# Patient Record
Sex: Male | Born: 1955 | Race: White | Hispanic: No | Marital: Married | Smoking: Former smoker
Health system: Southern US, Community
[De-identification: ages and names within clinical notes are randomized; demographics above are authoritative.]

## PROBLEM LIST (undated history)

## (undated) DIAGNOSIS — N200 Calculus of kidney: Secondary | ICD-10-CM

## (undated) HISTORY — PX: COLONOSCOPY: SHX174

---

## 2008-01-05 ENCOUNTER — Emergency Department: Payer: Self-pay | Admitting: Emergency Medicine

## 2013-01-06 ENCOUNTER — Ambulatory Visit: Payer: Self-pay | Admitting: Medical

## 2013-12-25 ENCOUNTER — Ambulatory Visit: Payer: Self-pay | Admitting: Gastroenterology

## 2015-02-10 ENCOUNTER — Ambulatory Visit: Payer: Self-pay | Admitting: General Practice

## 2015-05-10 ENCOUNTER — Ambulatory Visit: Admit: 2015-05-10 | Payer: Self-pay | Admitting: Specialist

## 2015-05-10 SURGERY — CARPAL TUNNEL RELEASE
Anesthesia: Choice | Laterality: Right

## 2015-06-30 ENCOUNTER — Ambulatory Visit
Admission: RE | Admit: 2015-06-30 | Discharge: 2015-06-30 | Disposition: A | Discharge status: Home / Self Care | Payer: Worker's Compensation | Source: Ambulatory Visit | Attending: Medical | Admitting: Medical

## 2015-06-30 ENCOUNTER — Other Ambulatory Visit: Payer: Self-pay | Admitting: Medical

## 2015-06-30 DIAGNOSIS — M795 Residual foreign body in soft tissue: Secondary | ICD-10-CM

## 2015-08-19 DIAGNOSIS — R683 Clubbing of fingers: Secondary | ICD-10-CM | POA: Insufficient documentation

## 2015-08-19 DIAGNOSIS — D696 Thrombocytopenia, unspecified: Secondary | ICD-10-CM | POA: Insufficient documentation

## 2018-01-07 ENCOUNTER — Ambulatory Visit: Payer: Self-pay | Admitting: Medical

## 2018-01-07 VITALS — BP 140/84 | HR 59 | Temp 97.8°F | Resp 18 | Wt 213.4 lb

## 2018-01-07 DIAGNOSIS — F439 Reaction to severe stress, unspecified: Secondary | ICD-10-CM

## 2018-01-07 NOTE — Progress Notes (Signed)
Subjective:    Patient ID: Tim Hendrix, male    DOB: 01-09-1956, 62 y.o.   MRN: [id number]  HPI  62 yo male in non acute distress.   Patient encouraged by wife to come and get seen for memory. Missed a turn to his cottage and wife felt he should be checked out.   He says  It is stressful to live with his wife. Feels that at home he is walking on egg shells to see if she gets mad. She is constantly telling him what to do and "picking at me".  Wife's mother had Alzheimer's and so she wanted him checked out.  History of  Alzheimer's in paternal grandmother at  61yo  His father has mild Dementia at  61 yo still driving. His mother he says has some memory problems but no offical diagnosis. Still driving.Both live together and , youngest brother lives with them , "he never left home".  Wife told him if he does not see some one she will not let him burn wood in the fireplace at their home." so that is why I am here"   Review of Systems  Constitutional: Negative for chills and fever.  HENT: Positive for congestion (cold  2 weeks ago.) and sore throat (scracthcy at the start of his cold  2 days ago x 2 days, now resolvedd). Negative for ear pain.   Respiratory: Positive for cough (mild, every now and then ). Negative for shortness of breath.   Cardiovascular: Negative for chest pain.  Gastrointestinal: Negative for abdominal pain.  Endocrine: Negative for polydipsia, polyphagia and polyuria.  Musculoskeletal: Negative for myalgias.  Skin: Negative for rash.  Neurological: Negative for dizziness, tremors, seizures, syncope, facial asymmetry, speech difficulty, weakness, light-headedness and headaches.  Psychiatric/Behavioral: Positive for sleep disturbance (sometimes wake up a couple of times / night not every night). Negative for behavioral problems, confusion, decreased concentration, hallucinations, self-injury and suicidal ideas. The patient is not nervous/anxious.    Needs knee  replacement right side  Stress with family.  History of daugher pregnant at  61 yo but she is doing better now.  61 yo granddaughter has 3 children ages  615, 1.5, 3 months ( first child at  61 yo). And "that is stressful". They are on welfare.    Objective:   Physical Exam  Constitutional: He is oriented to person, place, and time. He appears well-developed and well-nourished.  HENT:  Head: Normocephalic and atraumatic.  Eyes: Conjunctivae and EOM are normal. Pupils are equal, round, and reactive to light.  Neck: Normal range of motion.  Cardiovascular: Normal rate, regular rhythm and normal heart sounds.  Pulmonary/Chest: Effort normal and breath sounds normal.  Neurological: He is alert and oriented to person, place, and time. He has normal strength. He displays a negative Romberg sign. GCS eye subscore is 4. GCS verbal subscore is 5. GCS motor subscore is 6.  Reflex Scores:      Patellar reflexes are 2+ on the right side and 2+ on the left side. Skin: Skin is warm and dry.  Psychiatric: He has a normal mood and affect. His speech is normal and behavior is normal. Judgment and thought content normal. Cognition and memory are normal.     Conversation and memory all within normal limitis.  Negative Rhomberg, finger to nose and heel to toe walk within normal limits.     Assessment & Plan:  Well exam  /  Stress MMSE done  30 points scored.Scanned.  Reviewed with patient stress , given [employer] and he was happy to receive that. Paperwork. Offered him to see Neurology but he feels he does not need to see them. He says his wife just "picks" at him and he wants to be able to use the fireplace to save money during the cold weather. Return to the clinic as needed.  Patient verbalizes understanding and has no questions at discharge.

## 2018-01-07 NOTE — Patient Instructions (Signed)
Stress and Stress Management Stress is a normal reaction to life events. It is what you feel when life demands more than you are used to or more than you can handle. Some stress can be useful. For example, the stress reaction can help you catch the last bus of the day, study for a test, or meet a deadline at work. But stress that occurs too often or for too long can cause problems. It can affect your emotional health and interfere with relationships and normal daily activities. Too much stress can weaken your immune system and increase your risk for physical illness. If you already have a medical problem, stress can make it worse. What are the causes? All sorts of life events may cause stress. An event that causes stress for one person may not be stressful for another person. Major life events commonly cause stress. These may be positive or negative. Examples include losing your job, moving into a new home, getting married, having a baby, or losing a loved one. Less obvious life events may also cause stress, especially if they occur day after day or in combination. Examples include working long hours, driving in traffic, caring for children, being in debt, or being in a difficult relationship. What are the signs or symptoms? Stress may cause emotional symptoms including, the following:  Anxiety. This is feeling worried, afraid, on edge, overwhelmed, or out of control.  Anger. This is feeling irritated or impatient.  Depression. This is feeling sad, down, helpless, or guilty.  Difficulty focusing, remembering, or making decisions.  Stress may cause physical symptoms, including the following:  Aches and pains. These may affect your head, neck, back, stomach, or other areas of your body.  Tight muscles or clenched jaw.  Low energy or trouble sleeping.  Stress may cause unhealthy behaviors, including the following:  Eating to feel better (overeating) or skipping meals.  Sleeping too little,  too much, or both.  Working too much or putting off tasks (procrastination).  Smoking, drinking alcohol, or using drugs to feel better.  How is this diagnosed? Stress is diagnosed through an assessment by your health care provider. Your health care provider will ask questions about your symptoms and any stressful life events.Your health care provider will also ask about your medical history and may order blood tests or other tests. Certain medical conditions and medicine can cause physical symptoms similar to stress. Mental illness can cause emotional symptoms and unhealthy behaviors similar to stress. Your health care provider may refer you to a mental health professional for further evaluation. How is this treated? Stress management is the recommended treatment for stress.The goals of stress management are reducing stressful life events and coping with stress in healthy ways. Techniques for reducing stressful life events include the following:  Stress identification. Self-monitor for stress and identify what causes stress for you. These skills may help you to avoid some stressful events.  Time management. Set your priorities, keep a calendar of events, and learn to say "no." These tools can help you avoid making too many commitments.  Techniques for coping with stress include the following:  Rethinking the problem. Try to think realistically about stressful events rather than ignoring them or overreacting. Try to find the positives in a stressful situation rather than focusing on the negatives.  Exercise. Physical exercise can release both physical and emotional tension. The key is to find a form of exercise you enjoy and do it regularly.  Relaxation techniques. These relax the body and  mind. Examples include yoga, meditation, tai chi, biofeedback, deep breathing, progressive muscle relaxation, listening to music, being out in nature, journaling, and other hobbies. Again, the key is to find  one or more that you enjoy and can do regularly.  Healthy lifestyle. Eat a balanced diet, get plenty of sleep, and do not smoke. Avoid using alcohol or drugs to relax.  Strong support network. Spend time with family, friends, or other people you enjoy being around.Express your feelings and talk things over with someone you trust.  Counseling or talktherapy with a mental health professional may be helpful if you are having difficulty managing stress on your own. Medicine is typically not recommended for the treatment of stress.Talk to your health care provider if you think you need medicine for symptoms of stress. Follow these instructions at home:  Keep all follow-up visits as directed by your health care provider.  Take all medicines as directed by your health care provider. Contact a health care provider if:  Your symptoms get worse or you start having new symptoms.  You feel overwhelmed by your problems and can no longer manage them on your own. Get help right away if:  You feel like hurting yourself or someone else. This information is not intended to replace advice given to you by your health care provider. Make sure you discuss any questions you have with your health care provider. Document Released: 05/29/2001 Document Revised: 05/10/2016 Document Reviewed: 07/28/2013 Elsevier Interactive Patient Education  2017 Elsevier Inc.  

## 2018-04-08 ENCOUNTER — Ambulatory Visit: Payer: Self-pay | Admitting: Medical

## 2018-04-08 VITALS — BP 152/84 | HR 61 | Temp 98.1°F | Resp 16 | Ht 69.0 in | Wt 207.6 lb

## 2018-04-08 DIAGNOSIS — K649 Unspecified hemorrhoids: Secondary | ICD-10-CM

## 2018-04-08 MED ORDER — HYDROCORTISONE ACE-PRAMOXINE 1-1 % RE FOAM: 1.0000 | Freq: Two times a day (BID) | RECTAL | 0 refills | Status: DC

## 2018-04-08 NOTE — Progress Notes (Signed)
   Subjective:    Patient ID: Noland Fordyceorman H Lemmerman, male    DOB: [birth year], 62 y.o.   MRN: [id number]  HPI 62 yo male in non acute distress. Comes in today presenting with complaints of Hemorrhoid. Pain on Saturday. Previous history of hemmorrhoids told he had them internal hemorrhoid as well.. Has tried nothing over the counter. This hemorrhoid "isn't bleeding " nor have I had much pain since Saturday"    Review of Systems  Constitutional: Positive for chills. Negative for fever.  HENT: Negative for congestion, ear discharge and sore throat.   Eyes: Positive for pain (had irriation in both eyes with pollen and wearing contacts. no pain today and is wearing his contacts.). Negative for discharge and itching.  Respiratory: Negative for cough and shortness of breath.   Cardiovascular: Negative for chest pain, palpitations and leg swelling.  Gastrointestinal: Positive for anal bleeding and rectal pain (with wiping "a little bit"). Negative for abdominal distention, abdominal pain, blood in stool, constipation, diarrhea, nausea and vomiting.  Endocrine: Negative for cold intolerance and heat intolerance.  Genitourinary: Negative for difficulty urinating and dysuria.  Musculoskeletal: Negative for myalgias.  Skin: Negative for rash.  Allergic/Immunologic: Positive for environmental allergies. Negative for food allergies and immunocompromised state.  Neurological: Negative for dizziness, syncope and light-headedness.  Hematological: Negative for adenopathy.  Psychiatric/Behavioral: Negative for behavioral problems, self-injury and suicidal ideas. The patient is not nervous/anxious.    Colonoscopy at  56 next one at  62 yo.    Objective:   Physical Exam  Constitutional: He is oriented to person, place, and time. He appears well-developed and well-nourished.  HENT:  Head: Normocephalic and atraumatic.  Eyes: Pupils are equal, round, and reactive to light. Conjunctivae and EOM are normal.  Neck:  Normal range of motion. Neck supple.  Genitourinary:     Neurological: He is alert and oriented to person, place, and time.  Skin: Skin is warm and dry.  Psychiatric: He has a normal mood and affect. His behavior is normal. Judgment and thought content normal.  Nursing note and vitals reviewed.    Multimedia programmerBrandy Cozart RN chaperoned  Noted hemorrhoid the size of a small grape, not thrombosed. Not bleeding. Small skin tag noted at  12 O'Clock.    Assessment & Plan:  Hemorrhoids rectum. Warm Sitz baths. Recommended a stool softener like  OTC Senokot.  Meds ordered this encounter  Medications  . hydrocortisone-pramoxine (PROCTOFOAM HC) rectal foam    Sig: Place 1 applicator rectally 2 (two) times daily.    Dispense:  10 g    Refill:  0  Return on May 1st for recheck. Check on Pateint status of Tdap in [facility] currently down. Patient verbalizes understanding and has no questions at discharge.

## 2018-04-08 NOTE — Patient Instructions (Addendum)
How to Take a Sitz Bath A sitz bath is a warm water bath that is taken while you are sitting down. The water should only come up to your hips and should cover your buttocks. Your health care provider may recommend a sitz bath to help you:  Clean the lower part of your body, including your genital area.  With itching.  With pain.  With sore muscles or muscles that tighten or spasm.  How to take a sitz bath Take 3-4 sitz baths per day or as told by your health care provider. 1. Partially fill a bathtub with warm water. You will only need the water to be deep enough to cover your hips and buttocks when you are sitting in it. 2. If your health care provider told you to put medicine in the water, follow the directions exactly. 3. Sit in the water and open the tub drain a little. 4. Turn on the warm water again to keep the tub at the correct level. Keep the water running constantly. 5. Soak in the water for 15-20 minutes or as told by your health care provider. 6. After the sitz bath, pat the affected area dry first. Do not rub it. 7. Be careful when you stand up after the sitz bath because you may feel dizzy.  Contact a health care provider if:  Your symptoms get worse. Do not continue with sitz baths if your symptoms get worse.  You have new symptoms. Do not continue with sitz baths until you talk with your health care provider. This information is not intended to replace advice given to you by your health care provider. Make sure you discuss any questions you have with your health care provider. Document Released: 08/25/2004 Document Revised: 05/02/2016 Document Reviewed: 12/01/2014 Elsevier Interactive Patient Education  2018 [employer]. OTC Senokot take as directed.   Hemorrhoids Hemorrhoids are swollen veins in and around the rectum or anus. Hemorrhoids can cause pain, itching, or bleeding. Most of the time, they do not cause serious problems. They usually get better with diet  changes, lifestyle changes, and other home treatments. Follow these instructions at home: Eating and drinking  Eat foods that have fiber, such as whole grains, beans, nuts, fruits, and vegetables. Ask your doctor about taking products that have added fiber (fibersupplements).  Drink enough fluid to keep your pee (urine) clear or pale yellow. For Pain and Swelling  Take a warm-water bath (sitz bath) for 20 minutes to ease pain. Do this 3-4 times a day.  If directed, put ice on the painful area. It may be helpful to use ice between your warm baths. ? Put ice in a plastic bag. ? Place a towel between your skin and the bag. ? Leave the ice on for 20 minutes, 2-3 times a day. General instructions  Take over-the-counter and prescription medicines only as told by your doctor. ? Medicated creams and medicines that are inserted into the anus (suppositories) may be used or applied as told.  Exercise often.  Go to the bathroom when you have the urge to poop (to have a bowel movement). Do not wait.  Avoid pushing too hard (straining) when you poop.  Keep the butt area dry and clean. Use wet toilet paper or moist paper towels.  Do not sit on the toilet for a long time. Contact a doctor if:  You have any of these: ? Pain and swelling that do not get better with treatment or medicine. ? Bleeding that will  not stop. ? Trouble pooping or you cannot poop. ? Pain or swelling outside the area of the hemorrhoids. This information is not intended to replace advice given to you by your health care provider. Make sure you discuss any questions you have with your health care provider. Document Released: 09/11/2008 Document Revised: 05/10/2016 Document Reviewed: 08/17/2015 Elsevier Interactive Patient Education  2018 [employer]  Constipation, Adult Constipation is when a person:  Poops (has a bowel movement) fewer times in a week than normal.  Has a hard time pooping.  Has poop that is dry,  hard, or bigger than normal.  Follow these instructions at home: Eating and drinking   Eat foods that have a lot of fiber, such as: ? Fresh fruits and vegetables. ? Whole grains. ? Beans.  Eat less of foods that are high in fat, low in fiber, or overly processed, such as: ? [place] fries. ? Hamburgers. ? Cookies. ? Candy. ? Soda.  Drink enough fluid to keep your pee (urine) clear or pale yellow. General instructions  Exercise regularly or as told by your doctor.  Go to the restroom when you feel like you need to poop. Do not hold it in.  Take over-the-counter and prescription medicines only as told by your doctor. These include any fiber supplements.  Do pelvic floor retraining exercises, such as: ? Doing deep breathing while relaxing your lower belly (abdomen). ? Relaxing your pelvic floor while pooping.  Watch your condition for any changes.  Keep all follow-up visits as told by your doctor. This is important. Contact a doctor if:  You have pain that gets worse.  You have a fever.  You have not pooped for 4 days.  You throw up (vomit).  You are not hungry.  You lose weight.  You are bleeding from the anus.  You have thin, pencil-like poop (stool). Get help right away if:  You have a fever, and your symptoms suddenly get worse.  You leak poop or have blood in your poop.  Your belly feels hard or bigger than normal (is bloated).  You have very bad belly pain.  You feel dizzy or you faint. This information is not intended to replace advice given to you by your health care provider. Make sure you discuss any questions you have with your health care provider. Document Released: 05/21/2008 Document Revised: 06/22/2016 Document Reviewed: 05/23/2016 Elsevier Interactive Patient Education  2018 [employer]

## 2018-04-16 ENCOUNTER — Ambulatory Visit: Payer: Self-pay | Admitting: Medical

## 2018-04-16 VITALS — BP 143/83 | HR 97 | Temp 98.0°F | Resp 16 | Ht 69.0 in | Wt 207.0 lb

## 2018-04-16 DIAGNOSIS — K644 Residual hemorrhoidal skin tags: Secondary | ICD-10-CM

## 2018-04-16 NOTE — Patient Instructions (Signed)
Hemorrhoids    Hemorrhoids are swollen veins in and around the rectum or anus. Hemorrhoids can cause pain, itching, or bleeding. Most of the time, they do not cause serious problems. They usually get better with diet changes, lifestyle changes, and other home treatments.  Follow these instructions at home:  Eating and drinking  · Eat foods that have fiber, such as whole grains, beans, nuts, fruits, and vegetables. Ask your doctor about taking products that have added fiber (fiber supplements).  · Drink enough fluid to keep your pee (urine) clear or pale yellow.  For Pain and Swelling  · Take a warm-water bath (sitz bath) for 20 minutes to ease pain. Do this 3-4 times a day.  · If directed, put ice on the painful area. It may be helpful to use ice between your warm baths.  ¨ Put ice in a plastic bag.  ¨ Place a towel between your skin and the bag.  ¨ Leave the ice on for 20 minutes, 2-3 times a day.  General instructions  · Take over-the-counter and prescription medicines only as told by your doctor.  ¨ Medicated creams and medicines that are inserted into the anus (suppositories) may be used or applied as told.  · Exercise often.  · Go to the bathroom when you have the urge to poop (to have a bowel movement). Do not wait.  · Avoid pushing too hard (straining) when you poop.  · Keep the butt area dry and clean. Use wet toilet paper or moist paper towels.  · Do not sit on the toilet for a long time.  Contact a doctor if:  · You have any of these:  ¨ Pain and swelling that do not get better with treatment or medicine.  ¨ Bleeding that will not stop.  ¨ Trouble pooping or you cannot poop.  ¨ Pain or swelling outside the area of the hemorrhoids.  This information is not intended to replace advice given to you by your health care provider. Make sure you discuss any questions you have with your health care provider.  Document Released: 09/11/2008 Document Revised: 05/10/2016 Document Reviewed: 08/17/2015  Elsevier  Interactive Patient Education © 2018 Elsevier Inc.   

## 2018-04-16 NOTE — Progress Notes (Signed)
   Subjective:    Patient ID: KAIVEN VESTER, male    DOB: 06-04-1956, 62 y.o.   MRN: [id number]  HPI 62 yo male in non acute distress. Returns for hemorrhoid recheck , says he is doing much better.Stool softener helped.  Started Methylprednisone on Monday for swelling in right knee after injection.  Sees [place] Neighbor PA-C. Doing better now.  Has been doing Sitz baths.  Last BM this morning at  7:00am , seemed normal per patient , no pain with bowel movement.  Review of Systems  Constitutional: Negative for chills and fever.  Respiratory: Negative for cough and shortness of breath.   Cardiovascular: Negative for chest pain.  Gastrointestinal: Negative for abdominal distention, abdominal pain, anal bleeding, blood in stool, constipation, diarrhea, nausea, rectal pain and vomiting.  Neurological: Negative for dizziness, syncope and light-headedness.  Psychiatric/Behavioral: Negative for self-injury and suicidal ideas. The patient is not nervous/anxious.        Objective:   Physical Exam  Constitutional: He is oriented to person, place, and time. He appears well-developed and well-nourished.  Neurological: He is alert and oriented to person, place, and time.  Skin: Skin is warm and dry.  Psychiatric: He has a normal mood and affect. His behavior is normal. Judgment and thought content normal.  Nursing note and vitals reviewed.     Slightly smaller , not thrombosed.pink and fleshy    Assessment & Plan:  Hemorrhoid improved On Methylprednisone for knee x 6 days. To try preparation H, since he is on oral steroids.  Return as needed if it continues to bother patient will refer to Surgery. Patient verbalizes understanding and has no questions at discharge.

## 2018-04-30 ENCOUNTER — Ambulatory Visit: Payer: Self-pay | Admitting: Medical

## 2018-04-30 VITALS — BP 135/88 | HR 72 | Temp 98.0°F | Resp 16 | Wt 203.2 lb

## 2018-04-30 DIAGNOSIS — K649 Unspecified hemorrhoids: Secondary | ICD-10-CM

## 2018-04-30 NOTE — Progress Notes (Signed)
   Subjective:    Patient ID: Tim Hendrix, male    DOB: March 08, 1956, 62 y.o.   MRN: [id number]  HPI 62 yo  Male in non acute distress. Using Preparation H on Hemorrhoid,  Shrinking still, BM  Twice a day , no pain. History of  Colonoscopy  At 56  All was fine per patient.  Also takes chondrotin and glucosamine.  Was taking Claritin and then stopped be cause he felt better, Review of Systems  Constitutional: Negative for chills, fatigue and fever.  HENT: Positive for congestion. Negative for ear pain and sore throat.   Eyes: Negative for discharge, itching and visual disturbance.  Respiratory: Positive for cough and wheezing (better now , occasionally with mowing the grass). Negative for chest tightness.   Cardiovascular: Negative for chest pain, palpitations and leg swelling.  Gastrointestinal: Negative for abdominal pain, anal bleeding, blood in stool, constipation, diarrhea, nausea and vomiting.  Endocrine: Negative for polydipsia, polyphagia and polyuria.  Genitourinary: Negative for dysuria and hematuria.  Musculoskeletal: Positive for arthralgias (" I have joint pain , needs knee replacement on the right  sees  Cala Bradford PA-C" sometimes shoulder pain right side. Marland Kitchen). Negative for myalgias.  Skin: Negative for rash.  Allergic/Immunologic: Positive for environmental allergies. Negative for food allergies.  Neurological: Negative for dizziness, syncope and light-headedness.  Hematological: Negative for adenopathy.  Psychiatric/Behavioral: Negative for behavioral problems, confusion, self-injury and suicidal ideas. The patient is not nervous/anxious.        Objective:   Physical Exam  Constitutional: He appears well-developed and well-nourished.  HENT:  Head: Normocephalic and atraumatic.  Eyes: Pupils are equal, round, and reactive to light. Conjunctivae and EOM are normal.  Genitourinary:  Genitourinary Comments: Hemorrhoid on the 12 O'clock side of rectum.  Skin:  Skin is warm and dry.  Psychiatric: He has a normal mood and affect. His behavior is normal. Judgment and thought content normal.  Nursing note and vitals reviewed.  Non thrombosed, shrinking some from initial visit.        Assessment & Plan:  Hemorrhoid Rawness of the buttock area is now resolved.  Hemorrhoid at  12O'Clock shrinking , patient to continue Preparation H as directed. Return to the clinic if any concerns. Explained that diarrhea or constipation can cause further inflammation. He verbalizes understanding and has no questions at this time.

## 2018-04-30 NOTE — Patient Instructions (Signed)
How to Take a Sitz Bath A sitz bath is a warm water bath that is taken while you are sitting down. The water should only come up to your hips and should cover your buttocks. Your health care provider may recommend a sitz bath to help you:  Clean the lower part of your body, including your genital area.  With itching.  With pain.  With sore muscles or muscles that tighten or spasm.  How to take a sitz bath Take 3-4 sitz baths per day or as told by your health care provider. 1. Partially fill a bathtub with warm water. You will only need the water to be deep enough to cover your hips and buttocks when you are sitting in it. 2. If your health care provider told you to put medicine in the water, follow the directions exactly. 3. Sit in the water and open the tub drain a little. 4. Turn on the warm water again to keep the tub at the correct level. Keep the water running constantly. 5. Soak in the water for 15-20 minutes or as told by your health care provider. 6. After the sitz bath, pat the affected area dry first. Do not rub it. 7. Be careful when you stand up after the sitz bath because you may feel dizzy.  Contact a health care provider if:  Your symptoms get worse. Do not continue with sitz baths if your symptoms get worse.  You have new symptoms. Do not continue with sitz baths until you talk with your health care provider. This information is not intended to replace advice given to you by your health care provider. Make sure you discuss any questions you have with your health care provider. Document Released: 08/25/2004 Document Revised: 05/02/2016 Document Reviewed: 12/01/2014 Elsevier Interactive Patient Education  2018 Elsevier Inc. Hemorrhoids Hemorrhoids are swollen veins in and around the rectum or anus. Hemorrhoids can cause pain, itching, or bleeding. Most of the time, they do not cause serious problems. They usually get better with diet changes, lifestyle changes, and other  home treatments. Follow these instructions at home: Eating and drinking  Eat foods that have fiber, such as whole grains, beans, nuts, fruits, and vegetables. Ask your doctor about taking products that have added fiber (fibersupplements).  Drink enough fluid to keep your pee (urine) clear or pale yellow. For Pain and Swelling  Take a warm-water bath (sitz bath) for 20 minutes to ease pain. Do this 3-4 times a day.  If directed, put ice on the painful area. It may be helpful to use ice between your warm baths. ? Put ice in a plastic bag. ? Place a towel between your skin and the bag. ? Leave the ice on for 20 minutes, 2-3 times a day. General instructions  Take over-the-counter and prescription medicines only as told by your doctor. ? Medicated creams and medicines that are inserted into the anus (suppositories) may be used or applied as told.  Exercise often.  Go to the bathroom when you have the urge to poop (to have a bowel movement). Do not wait.  Avoid pushing too hard (straining) when you poop.  Keep the butt area dry and clean. Use wet toilet paper or moist paper towels.  Do not sit on the toilet for a long time. Contact a doctor if:  You have any of these: ? Pain and swelling that do not get better with treatment or medicine. ? Bleeding that will not stop. ? Trouble pooping or you   cannot poop. ? Pain or swelling outside the area of the hemorrhoids. This information is not intended to replace advice given to you by your health care provider. Make sure you discuss any questions you have with your health care provider. Document Released: 09/11/2008 Document Revised: 05/10/2016 Document Reviewed: 08/17/2015 Elsevier Interactive Patient Education  2018 Elsevier Inc.  

## 2019-01-28 ENCOUNTER — Emergency Department
Admission: EM | Admit: 2019-01-28 | Discharge: 2019-01-28 | Disposition: A | Discharge status: Home / Self Care | Payer: BLUE CROSS/BLUE SHIELD | Attending: Student in an Organized Health Care Education/Training Program | Admitting: Student in an Organized Health Care Education/Training Program

## 2019-01-28 ENCOUNTER — Other Ambulatory Visit: Payer: Self-pay

## 2019-01-28 ENCOUNTER — Encounter: Payer: Self-pay | Admitting: Emergency Medicine

## 2019-01-28 ENCOUNTER — Emergency Department: Payer: BLUE CROSS/BLUE SHIELD

## 2019-01-28 DIAGNOSIS — N1330 Unspecified hydronephrosis: Secondary | ICD-10-CM | POA: Diagnosis not present

## 2019-01-28 DIAGNOSIS — R109 Unspecified abdominal pain: Secondary | ICD-10-CM

## 2019-01-28 DIAGNOSIS — Z87891 Personal history of nicotine dependence: Secondary | ICD-10-CM | POA: Diagnosis not present

## 2019-01-28 HISTORY — DX: Calculus of kidney: N20.0

## 2019-01-28 LAB — CBC WITH DIFFERENTIAL/PLATELET
ABS IMMATURE GRANULOCYTES: 0.01 10*3/uL (ref 0.00–0.07)
BASOS ABS: 0 10*3/uL (ref 0.0–0.1)
BASOS PCT: 0 %
EOS PCT: 0 %
Eosinophils Absolute: 0 10*3/uL (ref 0.0–0.5)
HCT: 45.8 % (ref 39.0–52.0)
Hemoglobin: 15.9 g/dL (ref 13.0–17.0)
Immature Granulocytes: 0 %
LYMPHS PCT: 12 %
Lymphs Abs: 1.2 10*3/uL (ref 0.7–4.0)
MCH: 31.7 pg (ref 26.0–34.0)
MCHC: 34.7 g/dL (ref 30.0–36.0)
MCV: 91.4 fL (ref 80.0–100.0)
MONO ABS: 0.4 10*3/uL (ref 0.1–1.0)
Monocytes Relative: 5 %
NEUTROS ABS: 7.8 10*3/uL — AB (ref 1.7–7.7)
Neutrophils Relative %: 83 %
PLATELETS: 165 10*3/uL (ref 150–400)
RBC: 5.01 MIL/uL (ref 4.22–5.81)
RDW: 11.9 % (ref 11.5–15.5)
WBC: 9.4 10*3/uL (ref 4.0–10.5)
nRBC: 0 % (ref 0.0–0.2)

## 2019-01-28 LAB — COMPREHENSIVE METABOLIC PANEL
ALBUMIN: 4.4 g/dL (ref 3.5–5.0)
ALT: 22 U/L (ref 0–44)
ANION GAP: 7 (ref 5–15)
AST: 23 U/L (ref 15–41)
Alkaline Phosphatase: 45 U/L (ref 38–126)
BUN: 17 mg/dL (ref 8–23)
CO2: 27 mmol/L (ref 22–32)
Calcium: 9 mg/dL (ref 8.9–10.3)
Chloride: 101 mmol/L (ref 98–111)
Creatinine, Ser: 0.79 mg/dL (ref 0.61–1.24)
GFR calc non Af Amer: >60 mL/min (ref >60)
GLUCOSE: 121 mg/dL — AB (ref 70–99)
Potassium: 4 mmol/L (ref 3.5–5.1)
SODIUM: 135 mmol/L (ref 135–145)
Total Bilirubin: 0.9 mg/dL (ref 0.3–1.2)
Total Protein: 7.2 g/dL (ref 6.5–8.1)

## 2019-01-28 LAB — URINALYSIS, COMPLETE (UACMP) WITH MICROSCOPIC
Bacteria, UA: NONE SEEN
Bilirubin Urine: NEGATIVE
GLUCOSE, UA: NEGATIVE mg/dL
Ketones, ur: NEGATIVE mg/dL
LEUKOCYTE UA: NEGATIVE
NITRITE: NEGATIVE
Protein, ur: NEGATIVE mg/dL
Specific Gravity, Urine: 1.027 (ref 1.005–1.030)
Squamous Epithelial / LPF: NONE SEEN (ref 0–5)
pH: 5 (ref 5.0–8.0)

## 2019-01-28 MED ORDER — KETOROLAC TROMETHAMINE 30 MG/ML IJ SOLN
15.0000 mg | Freq: Once | INTRAMUSCULAR | Status: AC
  Administered 2019-01-28: 15 mg via INTRAVENOUS
  Filled 2019-01-28: qty 1

## 2019-01-28 MED ORDER — TAMSULOSIN HCL 0.4 MG PO CAPS: 0.4000 mg | ORAL_CAPSULE | Freq: Every day | ORAL | 0 refills | Status: DC

## 2019-01-28 MED ORDER — PROMETHAZINE HCL 25 MG/ML IJ SOLN
12.5000 mg | Freq: Four times a day (QID) | INTRAMUSCULAR | Status: DC | PRN
  Administered 2019-01-28: 10:00:00 via INTRAVENOUS
  Filled 2019-01-28: qty 1

## 2019-01-28 MED ORDER — MORPHINE SULFATE (PF) 4 MG/ML IV SOLN
4.0000 mg | INTRAVENOUS | Status: DC | PRN
  Administered 2019-01-28: 4 mg via INTRAVENOUS
  Filled 2019-01-28: qty 1

## 2019-01-28 MED ORDER — ONDANSETRON HCL 4 MG PO TABS: 4.0000 mg | ORAL_TABLET | Freq: Every day | ORAL | 0 refills | Status: DC | PRN

## 2019-01-28 NOTE — ED Notes (Signed)
Patient ambulatory to room. Patient here for left sided flank pain that began this morning. History of kidney stones. Patient reports this feels similar. Even and non labored respirations noted. A&O x4.

## 2019-01-28 NOTE — ED Triage Notes (Signed)
Pt presents to ED via POV with c/o L sided flank pain. Pt states hx of kidney stones, is usually able to pass them on his own but today he feels like he cannot. Pt states L sided flank pain since 0230 this morning.

## 2019-01-28 NOTE — ED Provider Notes (Signed)
[facility] Emergency Department Provider Note    First MD Initiated Contact with Patient 01/28/19 [phone]     (approximate)  I have reviewed the triage vital signs and the nursing notes.   HISTORY  Chief Complaint Flank Pain    HPI Tim Hendrix is a 63 y.o. male presents the ER for evaluation of severe acute left flank and left groin pain that woke the patient up from sleep.  States he is having associated nausea.  Having trouble finding a comfortable position.  States it feels similar to previous kidney stones but has never had this much difficulty passing a stone or had this long duration of pain.  Denies any fevers but did have cold chills.  No recent antibiotics.    Past Medical History:  Diagnosis Date  . Kidney stones    History reviewed. No pertinent family history. Past Surgical History:  Procedure Laterality Date  . COLONOSCOPY     Patient Active Problem List   Diagnosis Date Noted  . 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 Thrombocytopenia (HCC) 08/19/2015      Prior to Admission medications   Medication Sig Start Date End Date Taking? Authorizing Provider  Cholecalciferol (VITAMIN D) 2000 units tablet Take 2,000 Units by mouth daily.    [provider]  Lidocaine-Glycerin (PREPARATION H RE) Place rectally.    [provider]  Omega-3 Fatty Acids (FISH OIL PO) Take 1 capsule by mouth daily.    [provider]  ondansetron (ZOFRAN) 4 MG tablet Take 1 tablet (4 mg total) by mouth daily as needed for nausea or vomiting. 01/28/19 01/28/20  Willy Eddy, MD  tamsulosin (FLOMAX) 0.4 MG CAPS capsule Take 1 capsule (0.4 mg total) by mouth daily after supper. 01/28/19   Willy Eddy, MD  Turmeric 500 MG CAPS Take 2 capsules by mouth daily.    [provider]  UNABLE TO FIND Joint Juice    [provider]    Allergies Penicillin g    Social History Social History   Tobacco Use  .  Smoking status: Former Games developer  . Smokeless tobacco: Never Used  Substance Use Topics  . Alcohol use: Yes    Comment: Seldom  . Drug use: Not Currently    Review of Systems Patient denies headaches, rhinorrhea, blurry vision, numbness, shortness of breath, chest pain, edema, cough, abdominal pain, nausea, vomiting, diarrhea, dysuria, fevers, rashes or hallucinations unless otherwise stated above in HPI. ____________________________________________   PHYSICAL EXAM:  VITAL SIGNS: Vitals:   01/28/19 1030 01/28/19 1130  BP: (!) 174/90 139/66  Pulse:  (!) 46  Resp:  16  Temp:    SpO2:  99%    Constitutional: Alert and oriented.  Eyes: Conjunctivae are normal.  Head: Atraumatic. Nose: No congestion/rhinnorhea. Mouth/Throat: Mucous membranes are moist.   Neck: No stridor. Painless ROM.  Cardiovascular: Normal rate, regular rhythm. Grossly normal heart sounds.  Good peripheral circulation. Respiratory: Normal respiratory effort.  No retractions. Lungs CTAB. Gastrointestinal: Soft and nontender. No distention. No abdominal bruits. + left CVA tenderness. Genitourinary:  Musculoskeletal: No lower extremity tenderness nor edema.  No joint effusions. Neurologic:  Normal speech and language. No gross focal neurologic deficits are appreciated. No facial droop Skin:  Skin is warm, dry and intact. No rash noted. Psychiatric: Mood and affect are normal. Speech and behavior are normal.  ____________________________________________   LABS (all labs ordered are listed, but only abnormal results are displayed)  Results for orders placed or  performed during the hospital encounter of 01/28/19 (from the past 24 hour(s))  CBC with Differential/Platelet     Status: Abnormal   Collection Time: 01/28/19  9:09 AM  Result Value Ref Range   WBC 9.4 4.0 - 10.5 K/uL   RBC 5.01 4.22 - 5.81 MIL/uL   Hemoglobin 15.9 13.0 - 17.0 g/dL   HCT 41.3 24.4 - 01.0 %   MCV 91.4 80.0 - 100.0 fL   MCH 31.7 26.0  - 34.0 pg   MCHC 34.7 30.0 - 36.0 g/dL   RDW 27.2 53.6 - 64.4 %   Platelets 165 150 - 400 K/uL   nRBC 0.0 0.0 - 0.2 %   Neutrophils Relative % 83 %   Neutro Abs 7.8 (H) 1.7 - 7.7 K/uL   Lymphocytes Relative 12 %   Lymphs Abs 1.2 0.7 - 4.0 K/uL   Monocytes Relative 5 %   Monocytes Absolute 0.4 0.1 - 1.0 K/uL   Eosinophils Relative 0 %   Eosinophils Absolute 0.0 0.0 - 0.5 K/uL   Basophils Relative 0 %   Basophils Absolute 0.0 0.0 - 0.1 K/uL   Immature Granulocytes 0 %   Abs Immature Granulocytes 0.01 0.00 - 0.07 K/uL  Comprehensive metabolic panel     Status: Abnormal   Collection Time: 01/28/19  9:09 AM  Result Value Ref Range   Sodium 135 135 - 145 mmol/L   Potassium 4.0 3.5 - 5.1 mmol/L   Chloride 101 98 - 111 mmol/L   CO2 27 22 - 32 mmol/L   Glucose, Bld 121 (H) 70 - 99 mg/dL   BUN 17 8 - 23 mg/dL   Creatinine, Ser 0.34 0.61 - 1.24 mg/dL   Calcium 9.0 8.9 - 74.2 mg/dL   Total Protein 7.2 6.5 - 8.1 g/dL   Albumin 4.4 3.5 - 5.0 g/dL   AST 23 15 - 41 U/L   ALT 22 0 - 44 U/L   Alkaline Phosphatase 45 38 - 126 U/L   Total Bilirubin 0.9 0.3 - 1.2 mg/dL   GFR calc non Af Amer >60 >60 mL/min   GFR calc Af Amer >60 >60 mL/min   Anion gap 7 5 - 15  Urinalysis, Complete w Microscopic     Status: Abnormal   Collection Time: 01/28/19 10:03 AM  Result Value Ref Range   Color, Urine YELLOW (A) YELLOW   APPearance CLOUDY (A) CLEAR   Specific Gravity, Urine 1.027 1.005 - 1.030   pH 5.0 5.0 - 8.0   Glucose, UA NEGATIVE NEGATIVE mg/dL   Hgb urine dipstick SMALL (A) NEGATIVE   Bilirubin Urine NEGATIVE NEGATIVE   Ketones, ur NEGATIVE NEGATIVE mg/dL   Protein, ur NEGATIVE NEGATIVE mg/dL   Nitrite NEGATIVE NEGATIVE   Leukocytes,Ua NEGATIVE NEGATIVE   RBC / HPF 0-5 0 - 5 RBC/hpf   WBC, UA 0-5 0 - 5 WBC/hpf   Bacteria, UA NONE SEEN NONE SEEN   Squamous Epithelial / LPF NONE SEEN 0 - 5   Mucus PRESENT     ____________________________________________ ____________________________________________  RADIOLOGY  I personally reviewed all radiographic images ordered to evaluate for the above acute complaints and reviewed radiology reports and findings.  These findings were personally discussed with the patient.  Please see medical record for radiology report.  ____________________________________________   PROCEDURES  Procedure(s) performed:  Procedures    Critical Care performed: no ____________________________________________   INITIAL IMPRESSION / ASSESSMENT AND PLAN / ED COURSE  Pertinent labs & imaging results that were available  during my care of the patient were reviewed by me and considered in my medical decision making (see chart for details).   DDX: stone, pyelo, AAA, dissection, msk strain, lumbago  Tim Hendrix is a 63 y.o. who presents to the ED with flank pain as described above.  Certainly concerning for kidney stone however is the worst pain that he has had therefore will order CT imaging to evaluate for recurrent stone versus AAA or other acute intra-abdominal process.  Will give IV pain medication IV fluids and check urine.  Clinical Course as of Jan 28 1554  Wed Jan 28, 2019  1110 Patient with improvement in symptoms.  Urinalysis shows no evidence of infection.  Renal function stable.  Does have mild right-sided hydro-likely secondary to recently passed stone.  As he is currently pain-free with no evidence of other insidious acute pathology do feel patient stable and appropriate for outpatient referral.   [PR]    Clinical Course User Index [PR] Willy Eddyobinson, Berton Butrick, MD     As part of my medical decision making, I reviewed the following data within the electronic MEDICAL RECORD NUMBER Nursing notes reviewed and incorporated, Labs reviewed, notes from prior ED visits and [place] Controlled Substance Database   ____________________________________________   FINAL  CLINICAL IMPRESSION(S) / ED DIAGNOSES  Final diagnoses:  Flank pain  Hydronephrosis, unspecified hydronephrosis type      NEW MEDICATIONS STARTED DURING THIS VISIT:  Discharge Medication List as of 01/28/2019 11:16 AM    START taking these medications   Details  ondansetron (ZOFRAN) 4 MG tablet Take 1 tablet (4 mg total) by mouth daily as needed for nausea or vomiting., Starting Wed 01/28/2019, Until Thu 01/28/2020, Normal    tamsulosin (FLOMAX) 0.4 MG CAPS capsule Take 1 capsule (0.4 mg total) by mouth daily after supper., Starting Wed 01/28/2019, Normal         Note:  This document was prepared using Dragon voice recognition software and may include unintentional dictation errors.    Willy Eddyobinson, Brieonna Crutcher, MD 01/28/19 1555

## 2019-02-06 ENCOUNTER — Ambulatory Visit (INDEPENDENT_AMBULATORY_CARE_PROVIDER_SITE_OTHER): Payer: BLUE CROSS/BLUE SHIELD | Admitting: Urology

## 2019-02-06 ENCOUNTER — Encounter: Payer: Self-pay | Admitting: Urology

## 2019-02-06 VITALS — BP 146/89 | HR 84 | Ht 70.0 in | Wt 210.0 lb

## 2019-02-06 DIAGNOSIS — N200 Calculus of kidney: Secondary | ICD-10-CM

## 2019-02-06 DIAGNOSIS — N1330 Unspecified hydronephrosis: Secondary | ICD-10-CM

## 2019-02-06 NOTE — Progress Notes (Signed)
02/06/2019 3:23 PM   Tim Hendrix 1956/08/07 [id number]  Referring provider: No referring provider defined for this encounter.  Chief Complaint  Patient presents with   Nephrolithiasis    New Patient    HPI: 63 year old male who presents today for further evaluation of left flank pain.  He was initially seen and evaluated in the emergency room on 01/28/2019 with severe acute onset of left flank pain which woke him up from sleep.  He underwent a noncontrast stone CT scan which showed left hydroureteronephrosis with surrounding stranding but no obvious calculus appreciated.  UA was unremarkable.  All other labs including WBC and creatinine were within normal limits.  He had a second episode of pain earlier this week in the same location.  He reports it was over his left lower quadrant radiated down to his testicle.  It was less severe than the first episode.  Lasted several hours and then resolved.  He has had no pain since.  He denies any associated urinary symptoms including no dysuria or gross hematuria.  No weight loss.  He does have a personal history of nephrolithiasis.  He is able to pass the stones spontaneously.  His last stone episode was greater than a year.    He drinks diet Coke, 2x daily.  He does eat a lot of meat.  No extra salt.    He does mention today that he has the stone that he passed several years ago and would like to bring it in for stone analysis.  PMH: Past Medical History:  Diagnosis Date   Kidney stones     Surgical History: Past Surgical History:  Procedure Laterality Date   COLONOSCOPY      Home Medications:  Allergies as of 02/06/2019      Reactions   Penicillin G Hives, Rash      Medication List       Accurate as of February 06, 2019  3:23 PM. Always use your most recent med list.        FISH OIL PO Take 1 capsule by mouth daily.   PREPARATION H RE Place rectally.   tamsulosin 0.4 MG Caps capsule Commonly known as:   FLOMAX Take 1 capsule (0.4 mg total) by mouth daily after supper.   Turmeric 500 MG Caps Take 2 capsules by mouth daily.   UNABLE TO FIND Joint Juice   Vitamin D 50 MCG (2000 UT) tablet Take 2,000 Units by mouth daily.       Allergies:  Allergies  Allergen Reactions   Penicillin G Hives and Rash    Family History: No family history on file.  Social History:  reports that he has quit smoking. He has never used smokeless tobacco. He reports current alcohol use. He reports previous drug use.  ROS: UROLOGY Frequent Urination?: No Hard to postpone urination?: No Burning/pain with urination?: No Get up at night to urinate?: No Leakage of urine?: No Urine stream starts and stops?: No Trouble starting stream?: No Do you have to strain to urinate?: No Blood in urine?: No Urinary tract infection?: No Sexually transmitted disease?: No Injury to kidneys or bladder?: No Painful intercourse?: No Weak stream?: No Erection problems?: No Penile pain?: No  Gastrointestinal Nausea?: No Vomiting?: No Indigestion/heartburn?: No Diarrhea?: No Constipation?: No  Constitutional Fever: No Night sweats?: No Weight loss?: No Fatigue?: No  Skin Skin rash/lesions?: No Itching?: No  Eyes Blurred vision?: No Double vision?: No  Ears/Nose/Throat Sore throat?: No Sinus problems?: No  Hematologic/Lymphatic Swollen glands?: No Easy bruising?: No  Cardiovascular Leg swelling?: No Chest pain?: No  Respiratory Cough?: No Shortness of breath?: No  Endocrine Excessive thirst?: No  Musculoskeletal Back pain?: No Joint pain?: No  Neurological Headaches?: No Dizziness?: No  Psychologic Depression?: No Anxiety?: No  Physical Exam: BP (!) 146/89    Pulse 84    Ht 5\' 10"  (1.778 m)    Wt 210 lb (95.3 kg)    BMI 30.13 kg/m   Constitutional:  Alert and oriented, No acute distress. HEENT: [place] AT, moist mucus membranes.  Trachea midline, no masses. Cardiovascular:  No clubbing, cyanosis, or edema. Respiratory: Normal respiratory effort, no increased work of breathing. GI: Abdomen is soft, nontender, nondistended, no abdominal masses GU: No CVA tenderness Lymph: No cervical or inguinal lymphadenopathy. Skin: No rashes, bruises or suspicious lesions. Neurologic: Grossly intact, no focal deficits, moving all 4 extremities. Psychiatric: Normal mood and affect.  Laboratory Data: Lab Results  Component Value Date   WBC 9.4 01/28/2019   HGB 15.9 01/28/2019   HCT 45.8 01/28/2019   MCV 91.4 01/28/2019   PLT 165 01/28/2019    Lab Results  Component Value Date   CREATININE 0.79 01/28/2019    Urinalysis Component     Latest Ref Rng & Units 01/28/2019  Color, Urine     YELLOW YELLOW (A)  Appearance     CLEAR CLOUDY (A)  Specific Gravity, Urine     1.005 - 1.030 1.027  pH     5.0 - 8.0 5.0  Glucose, UA     NEGATIVE mg/dL NEGATIVE  Hgb urine dipstick     NEGATIVE SMALL (A)  Bilirubin Urine     NEGATIVE NEGATIVE  Ketones, ur     NEGATIVE mg/dL NEGATIVE  Protein     NEGATIVE mg/dL NEGATIVE  Nitrite     NEGATIVE NEGATIVE  Leukocytes,Ua     NEGATIVE NEGATIVE  RBC / HPF     0 - 5 RBC/hpf 0-5  WBC, UA     0 - 5 WBC/hpf 0-5  Bacteria, UA     NONE SEEN NONE SEEN  Squamous Epithelial / LPF     0 - 5 NONE SEEN  Mucus      PRESENT    Pertinent Imaging: Results for orders placed during the hospital encounter of 01/28/19  CT Renal Stone Study   Narrative CLINICAL DATA:  Left-sided flank pain  EXAM: CT ABDOMEN AND PELVIS WITHOUT CONTRAST  TECHNIQUE: Multidetector CT imaging of the abdomen and pelvis was performed following the standard protocol without oral or IV contrast.  COMPARISON:  None.  FINDINGS: Lower chest: Lung bases are clear.  Hepatobiliary: No focal liver lesions are apparent on this noncontrast enhanced study. Gallbladder wall is not appreciably thickened. There is no biliary duct dilatation.  Pancreas: No  pancreatic mass or inflammatory change evident.  Spleen: No splenic lesions are appreciable.  Adrenals/Urinary Tract: Adrenals bilaterally appear normal. Left kidney is diffusely edematous. No renal masses evident on either side. There is moderate hydronephrosis on the left. There is no appreciable hydronephrosis on the right. There is a 1 mm calculus in the upper pole of the left kidney. There is a 1 mm calculus in the posterior aspect of the mid left kidney. There is no appreciable ureteral calculus on either side. Urinary bladder is midline. Urinary bladder is nearly completely empty. Urinary bladder wall is not felt to be thickened in this state.  Stomach/Bowel: There is no appreciable bowel wall  or mesenteric thickening. There is no appreciable bowel obstruction. No free air or portal venous air.  Vascular/Lymphatic: There is aortic and iliac artery atherosclerosis. No aneurysm evident. No adenopathy is appreciable in the abdomen or pelvis.  Reproductive: Prostate and seminal vesicles are normal in size and contour. No evident pelvic mass.  Other: Appendix appears normal. No evident abscess or ascites in the abdomen or pelvis. There is fat in each inguinal ring, slightly more on the left than on the right.  Musculoskeletal: There is degenerative change in the lumbar spine. 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 There is a hemangioma in the T11 vertebral body, benign in appearance. No blastic or lytic bone lesions. There is no intramuscular or abdominal wall lesion evident.  IMPRESSION: 1. Moderate hydronephrosis on the left with left renal edema and perinephric soft tissue stranding on the left. No ureteral calculus evident. Question recent calculus passage. Pyelonephritis on the left potentially could present in this manner. Appropriate laboratory correlation in this regard advised.  2.  Small nonobstructing calculi within the  left kidney noted.  3. No evident bowel obstruction. No abscess in the abdomen pelvis. Appendix appears normal.  4.  Aortoiliac atherosclerosis.  5. 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   Electronically Signed   By: Bretta BangWilliam  Woodruff III M.D.   On: 01/28/2019 10:08    CT scan was personally reviewed today.  Agree with radiologic interpretation.  There is moderate hydroureteronephrosis on the left without clear etiology.  Assessment & Plan:    There are no diagnoses linked to this encounter.  No follow-ups on file.  Vanna ScotlandAshley Letesha Klecker, MD  [facility] [address], [address] [place], [place] [address] [phone]

## 2019-02-09 ENCOUNTER — Telehealth: Payer: Self-pay | Admitting: Urology

## 2019-02-09 NOTE — Telephone Encounter (Signed)
Bill from LAB [facility] and requests a call back about a Lab ordered for Mr Kucharski. PH [phone]

## 2019-02-10 NOTE — Telephone Encounter (Signed)
Called back and left a mess for [employer]

## 2019-02-13 ENCOUNTER — Other Ambulatory Visit: Payer: Self-pay | Admitting: Urology

## 2019-02-13 ENCOUNTER — Ambulatory Visit: Payer: BLUE CROSS/BLUE SHIELD

## 2019-02-13 ENCOUNTER — Ambulatory Visit
Admission: RE | Admit: 2019-02-13 | Discharge: 2019-02-13 | Disposition: A | Discharge status: Home / Self Care | Payer: BLUE CROSS/BLUE SHIELD | Source: Ambulatory Visit | Attending: Urology | Admitting: Urology

## 2019-02-13 DIAGNOSIS — N1330 Unspecified hydronephrosis: Secondary | ICD-10-CM

## 2019-02-16 ENCOUNTER — Telehealth: Payer: Self-pay

## 2019-02-16 NOTE — Telephone Encounter (Signed)
Called and advised patient of [place] results. Patient verbalized understanding.

## 2019-02-16 NOTE — Telephone Encounter (Signed)
-----   Message from Vanna Scotland, MD sent at 02/16/2019  8:31 AM EST ----- Randie Heinz news, there is no swelling on either kidney.  Your stone has clearly passed.  You may follow-up as needed.  Vanna Scotland, MD

## 2019-10-12 ENCOUNTER — Other Ambulatory Visit: Payer: Self-pay

## 2019-10-12 ENCOUNTER — Ambulatory Visit: Payer: Self-pay

## 2019-10-12 DIAGNOSIS — Z23 Encounter for immunization: Secondary | ICD-10-CM

## 2019-10-21 ENCOUNTER — Ambulatory Visit: Payer: Self-pay

## 2019-10-23 IMAGING — CT CT RENAL STONE PROTOCOL
2 of 4 series · 16 of 46 positions shown, 18 images · non-contrast
Comparison: None.

CLINICAL DATA: Left-sided flank pain

EXAM:
CT ABDOMEN AND PELVIS WITHOUT CONTRAST
TECHNIQUE: Multidetector CT imaging of the abdomen and pelvis was performed
following the standard protocol without oral or IV contrast.

[Series 2: stone full standard · axial · 0.86mm/px · z∈[-581,-136]mm · 13 of 99 slices shown, 15 images]
[im 5/99  soft-tissue]
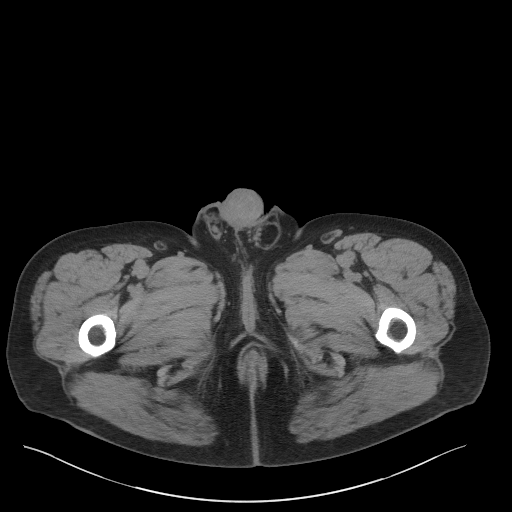
[im 5/99  bone]
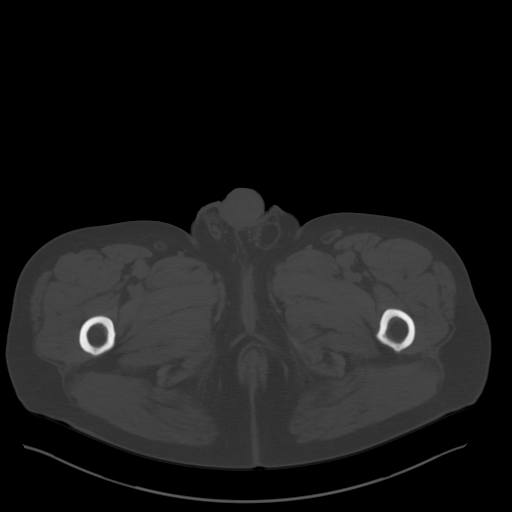
[im 13/99  soft-tissue]
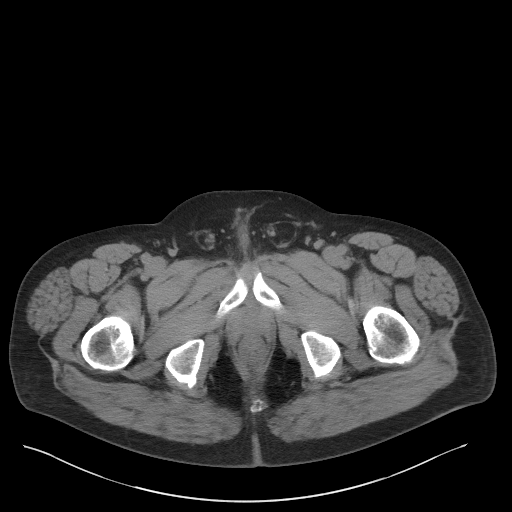
[im 21/99  soft-tissue]
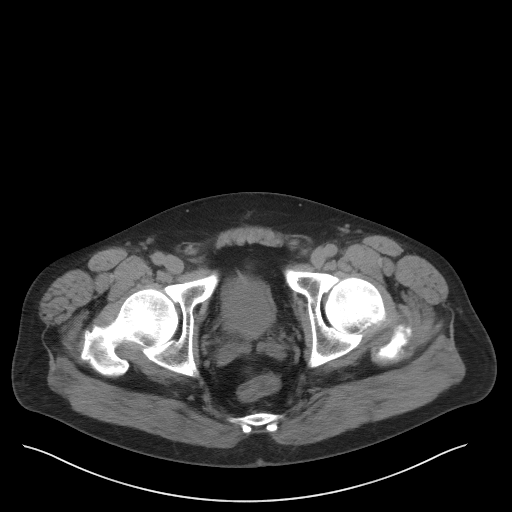
[im 29/99  soft-tissue]
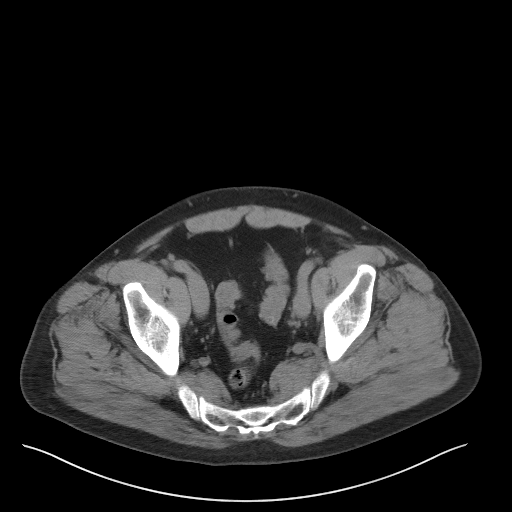
[im 33/99  soft-tissue]
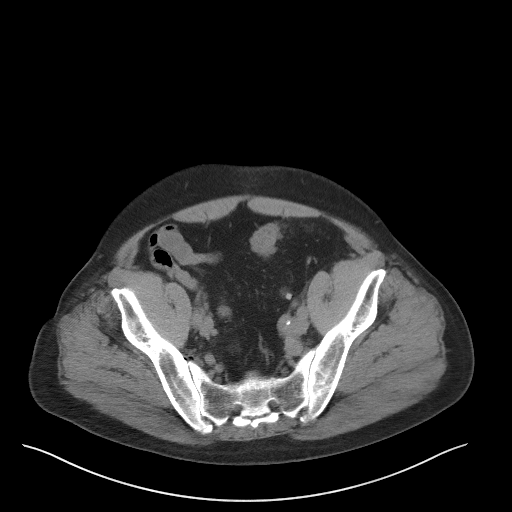
[im 41/99  soft-tissue]
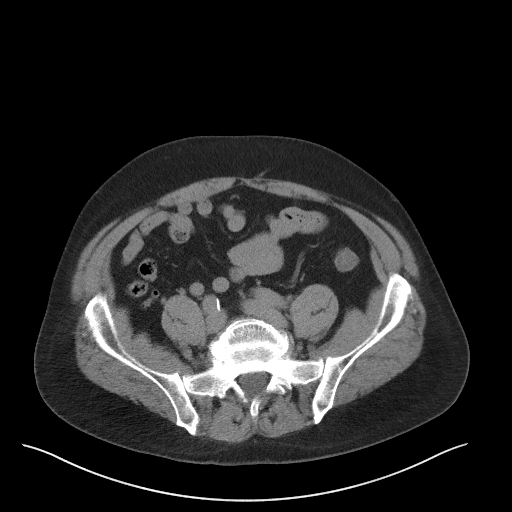
[im 50/99  soft-tissue]
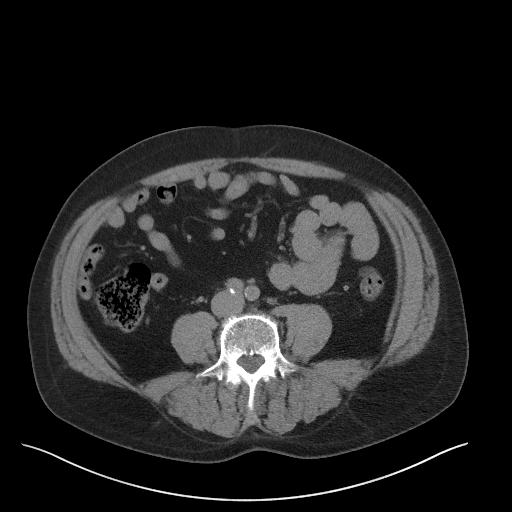
[im 58/99  soft-tissue]
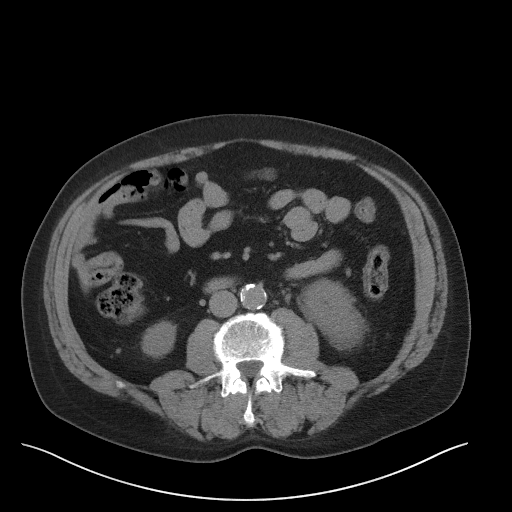
[im 66/99  soft-tissue]
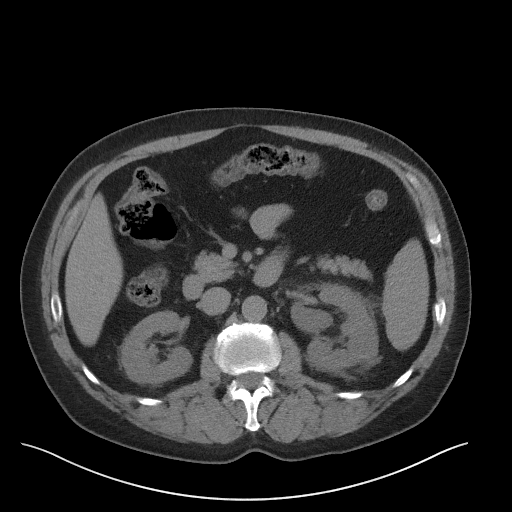
[im 66/99  bone]
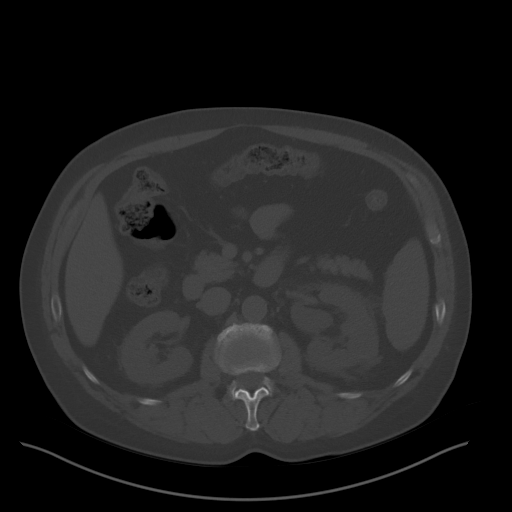
[im 70/99  soft-tissue]
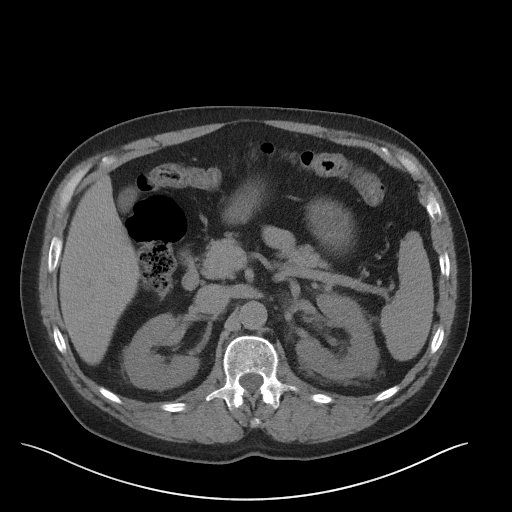
[im 78/99  soft-tissue]
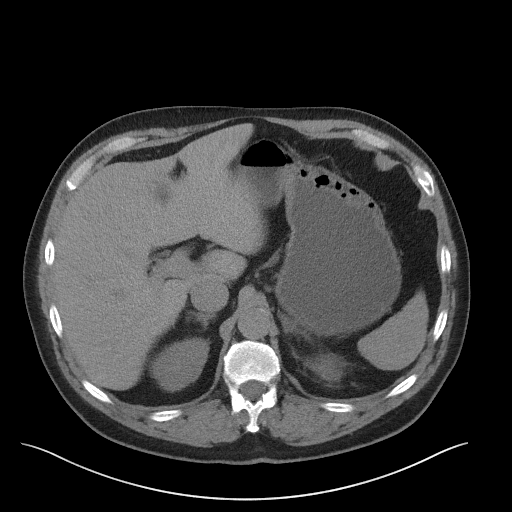
[im 86/99  soft-tissue]
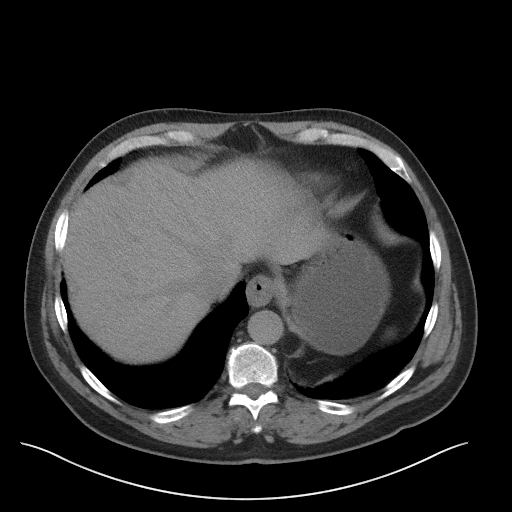
[im 94/99  soft-tissue]
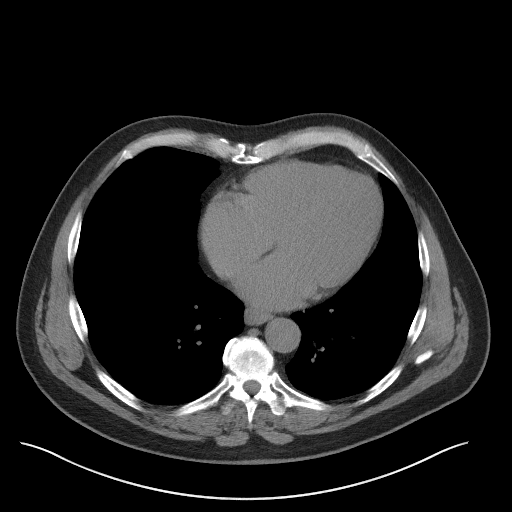

[Series 5: coronal · coronal · 0.88mm/px · 3 of 142 slices shown]
[im 48/142  soft-tissue]
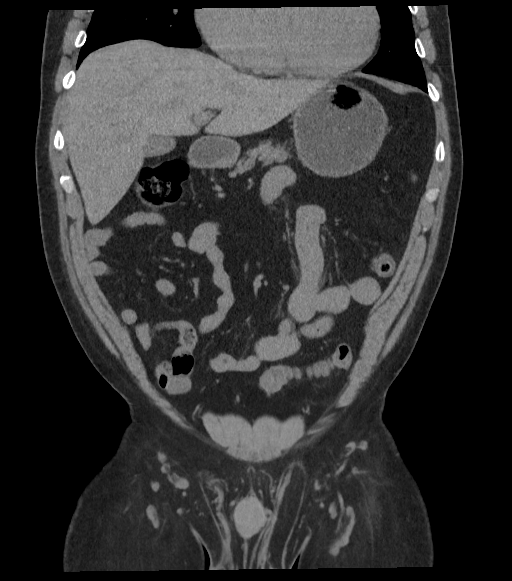
[im 63/142  soft-tissue]
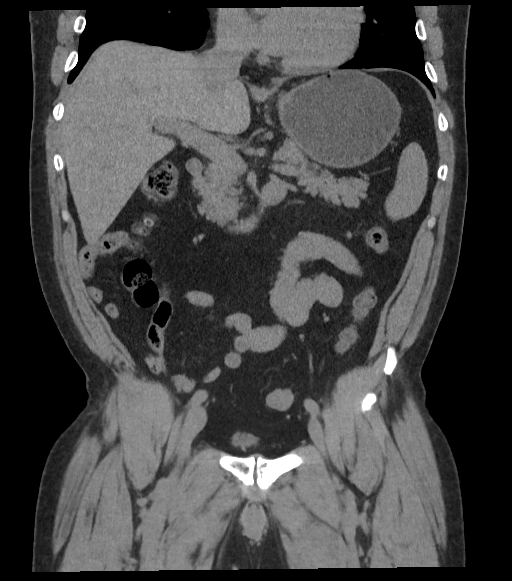
[im 79/142  soft-tissue]
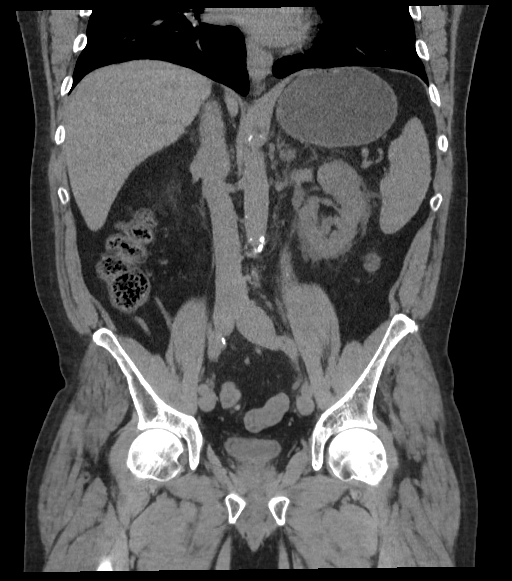

[16 of 46 positions shown; findings below may reference images not displayed]

FINDINGS: Lower chest: Lung bases are clear.

Hepatobiliary: No focal liver lesions are apparent on this
noncontrast enhanced study. Gallbladder wall is not appreciably
thickened. There is no biliary duct dilatation.

Pancreas: No pancreatic mass or inflammatory change evident.

Spleen: No splenic lesions are appreciable.

Adrenals/Urinary Tract: Adrenals bilaterally appear normal. Left
kidney is diffusely edematous. No renal masses evident on either
side. There is moderate hydronephrosis on the left. There is no
appreciable hydronephrosis on the right. There is a 1 mm calculus in
the upper pole of the left kidney. There is a 1 mm calculus in the
posterior aspect of the mid left kidney. There is no appreciable
ureteral calculus on either side. Urinary bladder is midline.
Urinary bladder is nearly completely empty. Urinary bladder wall is
not felt to be thickened in this state.

Stomach/Bowel: There is no appreciable bowel wall or mesenteric
thickening. There is no appreciable bowel obstruction. No free air
or portal venous air.

Vascular/Lymphatic: There is aortic and iliac artery
atherosclerosis. No aneurysm evident. No adenopathy is appreciable
in the abdomen or pelvis.

Reproductive: Prostate and seminal vesicles are normal in size and
contour. No evident pelvic mass.

Other: Appendix appears normal. No evident abscess or ascites in the
abdomen or pelvis. There is fat in each inguinal ring, slightly more
on the left than on the right.

Musculoskeletal: There is degenerative change in the lumbar spine.
There are pars defects at S1 bilaterally without appreciable
spondylolisthesis. There is a hemangioma in the T11 vertebral body,
benign in appearance. No blastic or lytic bone lesions. There is no
intramuscular or abdominal wall lesion evident.
IMPRESSION: 1. Moderate hydronephrosis on the left with left renal edema and
perinephric soft tissue stranding on the left. No ureteral calculus
evident. Question recent calculus passage. Pyelonephritis on the
left potentially could present in this manner. Appropriate
laboratory correlation in this regard advised.

2.  Small nonobstructing calculi within the left kidney noted.

3. No evident bowel obstruction. No abscess in the abdomen pelvis.
Appendix appears normal.

4.  Aortoiliac atherosclerosis.

5. Benign hemangioma in the T11 vertebral body. Pars defects at S1
bilaterally without appreciable spondylolisthesis.

## 2020-01-18 ENCOUNTER — Telehealth: Payer: Self-pay | Admitting: *Deleted

## 2020-01-18 NOTE — Telephone Encounter (Signed)
Tim Hendrix called [facility] today and states his wife was Covid + on 1/28, he is asymptomatic and wants to know what he needs to do. [name] to remain in quarantine and get tested on day 6 after exposure which would be 2/2 or 2/3 and follow the guidance of testing facility. [name] states he will go to [facility] drive thru testing, I encouraged him to let them know his situation and follow their guidance on returning to work. Also advised him to call Charlene in HR here at [facility] to work out leave and pay. [name] verbalizes understanding and has no further questions at this time.

## 2020-01-21 ENCOUNTER — Telehealth: Payer: Self-pay

## 2020-01-21 NOTE — Telephone Encounter (Signed)
Pt called to see when he could return to work since his covid test at the [facility] was negative; Wife tested + on 1/28 and he stated she returned to work today; he stated they were sleeping in separate areas of the house, wearing masks and socially distancing; he was tested on 2/2 and was negative; he has no symptoms; discussed with Elvin So, NP and instructed him he could return to work on 2/12; he states he doesn't work on Fridays so his next day scheduled to work would be 2/14; encouraged to continue to monitor for symptoms and to be seen and tested again if he develops symptoms; also encouraged to continue to wear masks, good hand washing and practice socially distancing; he verb u/o and appreciative of the phone call and instructions

## 2020-02-09 ENCOUNTER — Telehealth: Payer: Self-pay

## 2020-02-09 NOTE — Telephone Encounter (Signed)
Pt called to say he had cut his finger with a saw while at home and wanted to know if he could come to the clinic for stitches; instructed him to go to [facility] urgent care or any urgent care near his house to be evaluated; clinic is currently closed for lunch and provider not on site; pt verb u/o

## 2020-08-10 IMAGING — US US RENAL
1 series · 14 of 25 positions shown · non-contrast
Comparison: CT 01/28/2019

CLINICAL DATA: Left hydronephrosis, follow-up

EXAM:
RENAL / URINARY TRACT ULTRASOUND COMPLETE

[Series 1: us renal · 0.30mm/px · 14 of 34 slices shown]
[im 1/34]
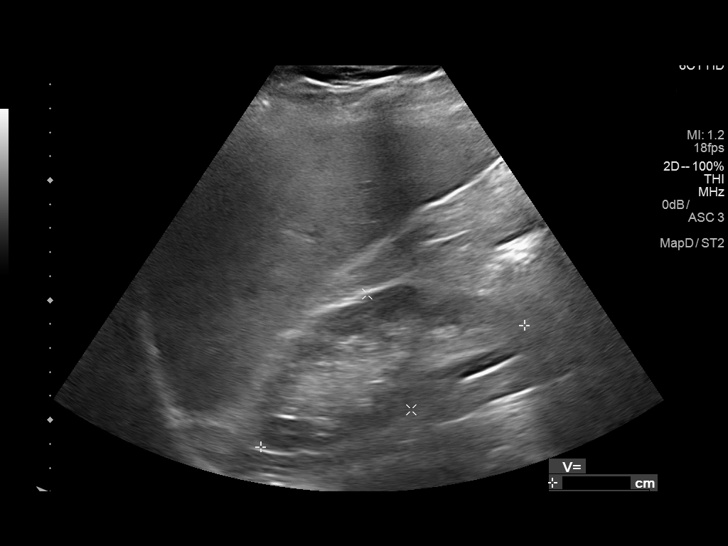
[im 3/34]
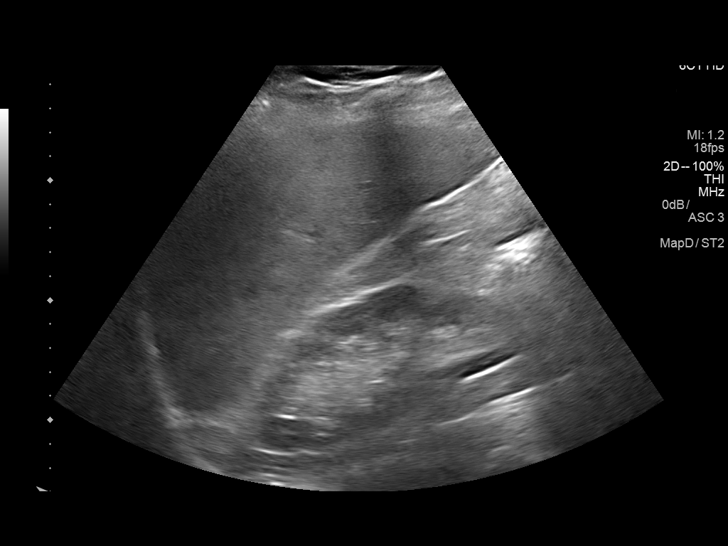
[im 6/34]
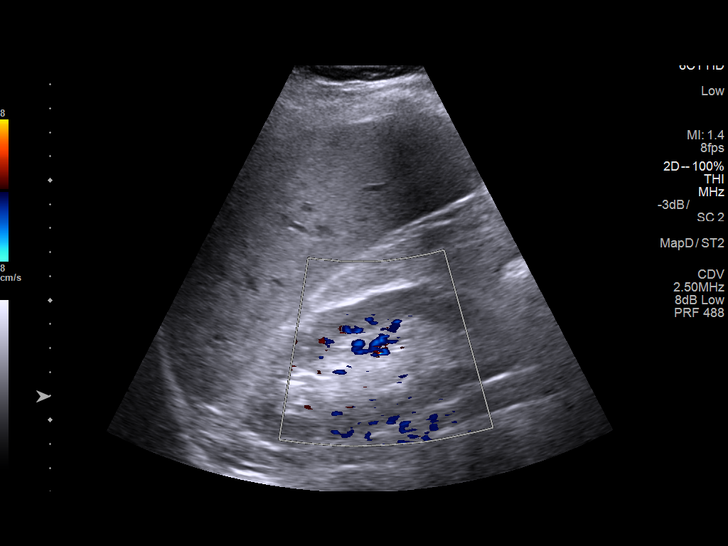
[im 9/34]
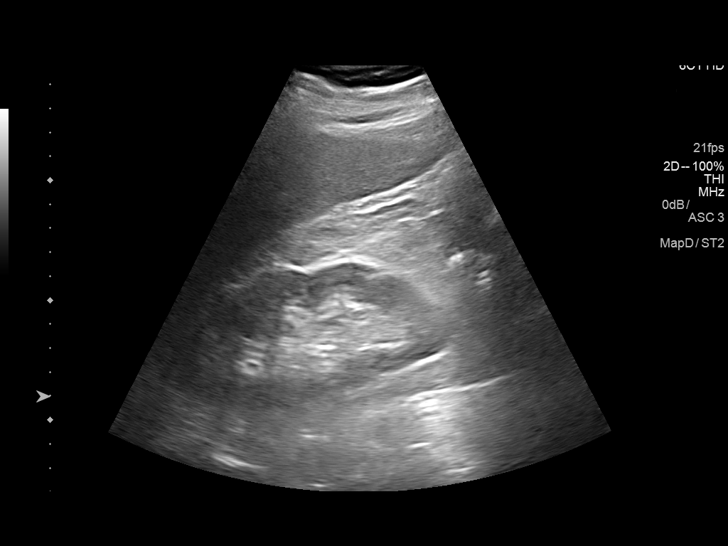
[im 12/34]
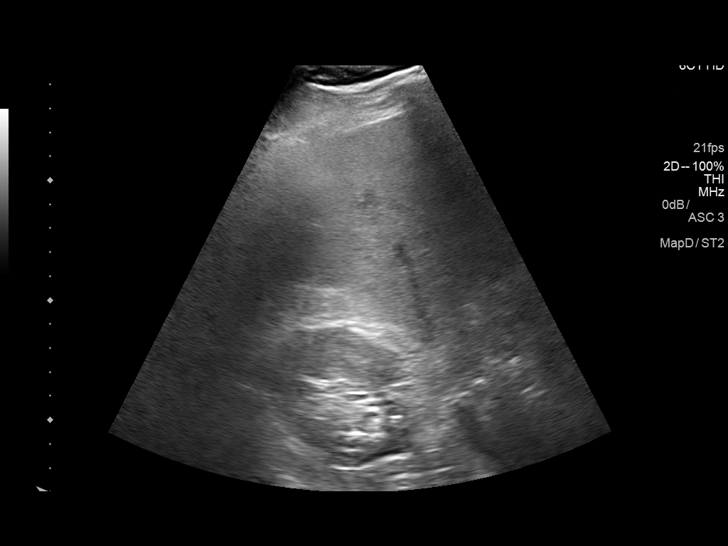
[im 13/34]
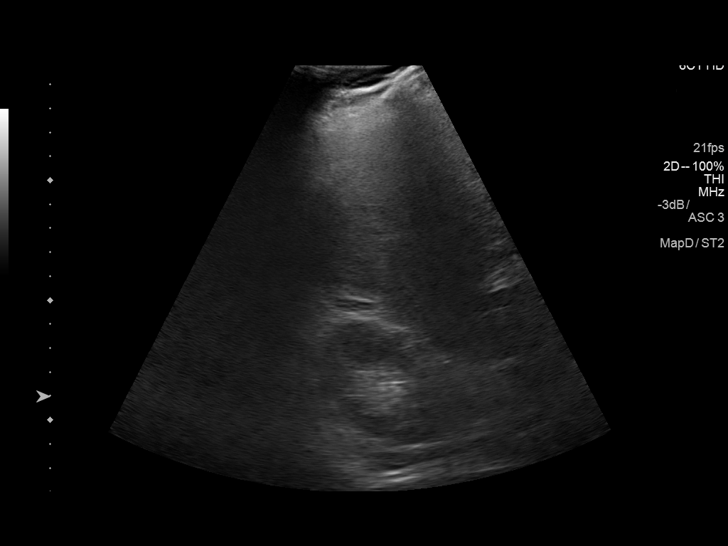
[im 16/34]
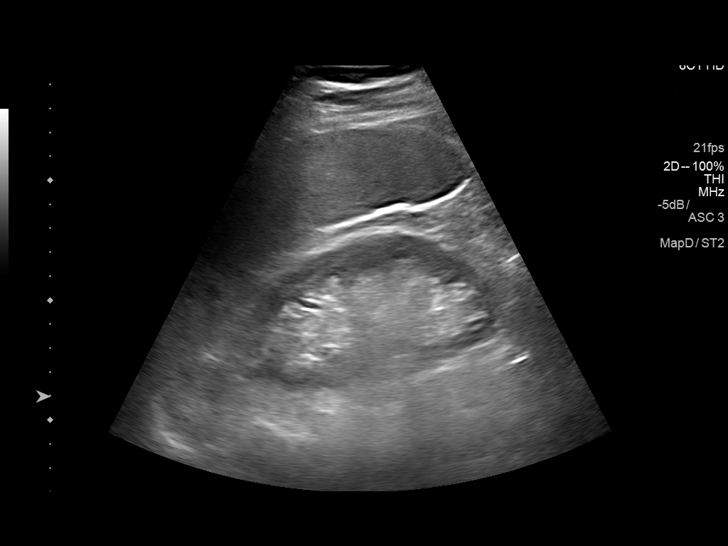
[im 18/34]
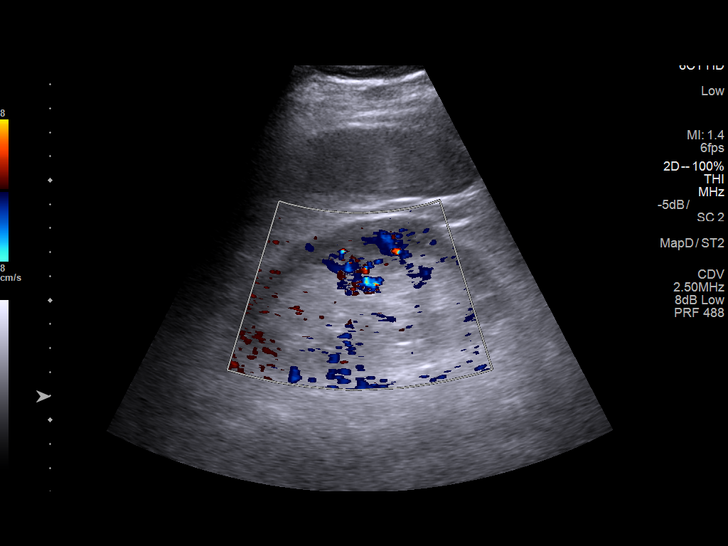
[im 21/34]
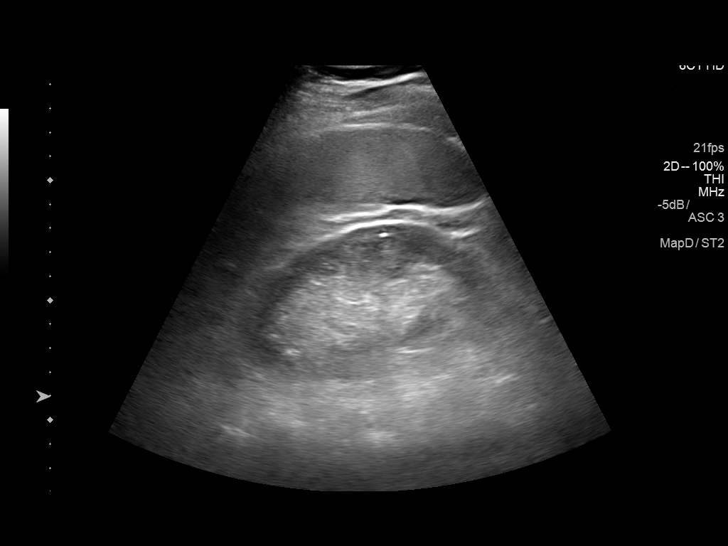
[im 23/34]
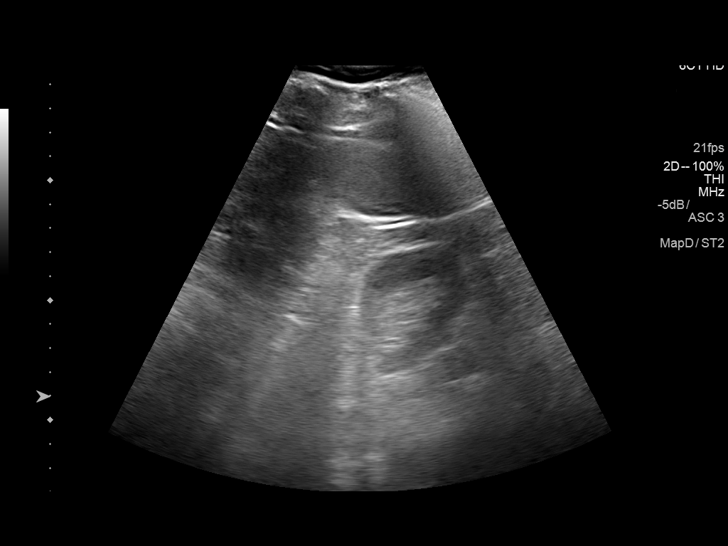
[im 25/34]
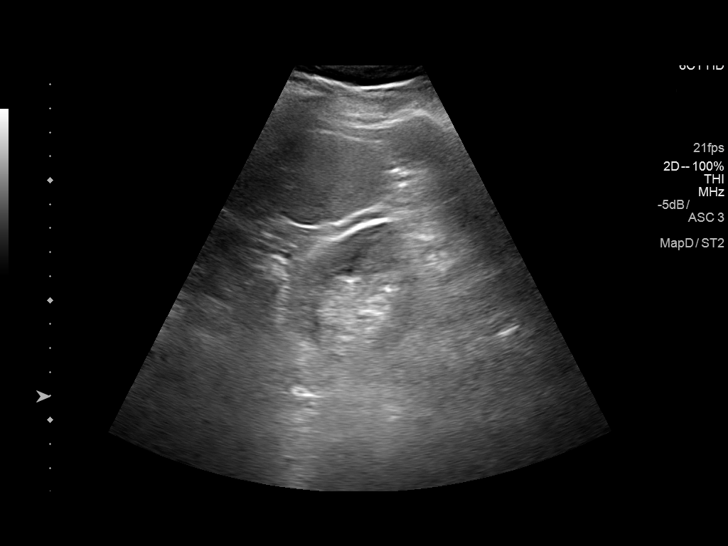
[im 28/34]
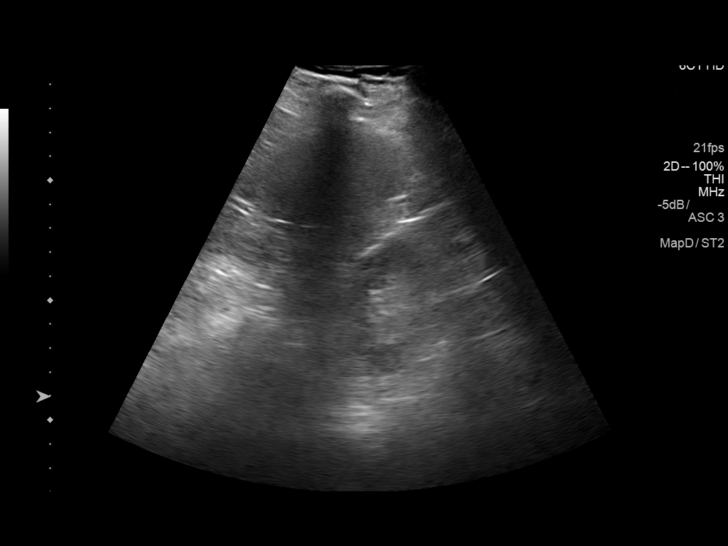
[im 31/34]
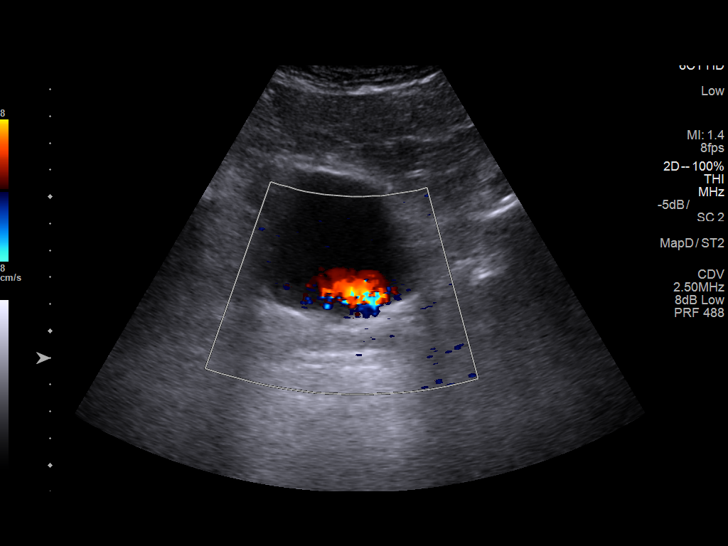
[im 34/34]
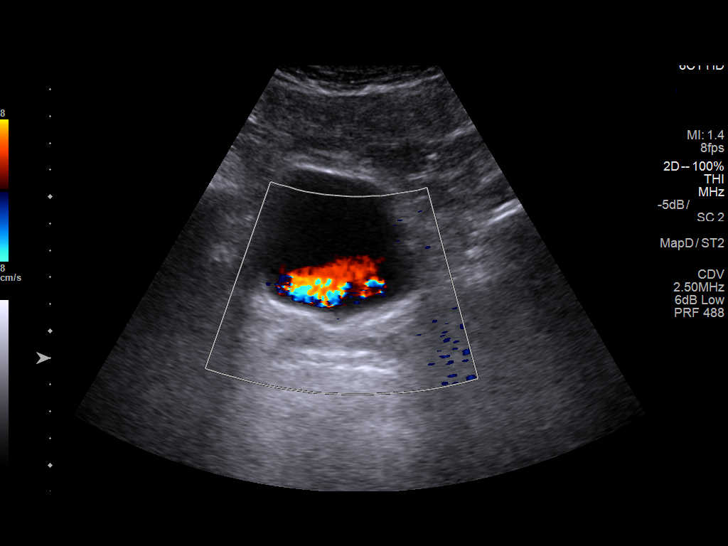

[14 of 25 positions shown; findings below may reference images not displayed]

FINDINGS: Right Kidney:

Renal measurements: 12.1 x 5.2 x 5.5 cm = volume: 180 mL .
Echogenicity within normal limits. No mass or hydronephrosis
visualized.

Left Kidney:

Renal measurements: 11.8 x 6.7 x 5.6 cm = volume: 232 mL.
Echogenicity within normal limits. No mass or hydronephrosis
visualized.

Bladder:

Appears normal for degree of bladder distention. Bilateral ureteral
jets documented.
IMPRESSION: 1. Negative.  Left hydronephrosis has resolved.

## 2021-09-26 ENCOUNTER — Ambulatory Visit: Payer: Self-pay | Admitting: Medical

## 2021-09-26 ENCOUNTER — Encounter: Payer: Self-pay | Admitting: Medical

## 2021-09-26 ENCOUNTER — Other Ambulatory Visit: Payer: Self-pay

## 2021-09-26 VITALS — BP 158/87 | HR 87 | Temp 97.7°F | Resp 16 | Ht 69.0 in | Wt 205.0 lb

## 2021-09-26 DIAGNOSIS — H18821 Corneal disorder due to contact lens, right eye: Secondary | ICD-10-CM

## 2021-09-26 DIAGNOSIS — R030 Elevated blood-pressure reading, without diagnosis of hypertension: Secondary | ICD-10-CM

## 2021-09-26 MED ORDER — CIPROFLOXACIN HCL 0.3 % OP SOLN: 1.0000 [drp] | OPHTHALMIC | 0 refills | Status: DC

## 2021-09-26 NOTE — Progress Notes (Addendum)
   Subjective:    Patient ID: Tim Hendrix, male    DOB: 03-09-1956, 65 y.o.   MRN: [id number]  HPI 65 yo  in non acute distress, had some discomfort yesterday while wearing contact. On the right eye. He wore  the contact the entire day did he got home.  He does not know if he used it too long or if the contact was old. Denies visual changes. Red sclera and  tearing a lot. He has been itching it. Used OTC lubricating drops from the pharmacy , starting today after lunch.  Blood pressure (!) 158/87, pulse 87, temperature 97.7 F (36.5 C), temperature source Oral, resp. rate 16, height 5\' 9"  (1.753 m), weight 205 lb (93 kg), SpO2 97 %.    Allergies  Allergen Reactions   Penicillin G Hives and Rash    Review of Systems  Eyes:  Positive for pain (discomfort) and redness. Negative for photophobia, discharge (just watering alot), itching and visual disturbance.      Objective:   Physical Exam Vitals and nursing note reviewed.  Constitutional:      Appearance: Normal appearance.  HENT:     Head: Normocephalic and atraumatic.  Eyes:     General: Vision grossly intact. Gaze aligned appropriately.     Extraocular Movements: Extraocular movements intact.     Conjunctiva/sclera:     Right eye: Right conjunctiva is injected. Chemosis present.     Pupils: Pupils are equal, round, and reactive to light.      Comments: Right eye tearing  Musculoskeletal:     Cervical back: Normal range of motion and neck supple.  Neurological:     Mental Status: He is alert.    20/40 R 20/40 L 20/40 bilateral  Right eye is swollen and red from patient itching the eye, he states he is itching it due to the watering of the eye. Fluorescein stain dye uptake around  the entire  edge of the cornea , worse on top portion.     Assessment & Plan:  Corneal abrasion.right eye from contact. Has an appointment for contacts at [facility] eye center on Thursday. He should still go I told patient so they can  check his eye. OTC ibuprofen 400mg  q6 hours. Cool compresses to help with swelling. He will follow up with me Monday to set up  PCP and discuss labs, as well as a recheck on his eye.

## 2021-10-02 ENCOUNTER — Encounter: Payer: Self-pay | Admitting: Medical

## 2021-10-02 ENCOUNTER — Other Ambulatory Visit: Payer: Self-pay

## 2021-10-02 ENCOUNTER — Ambulatory Visit: Payer: Self-pay | Admitting: Medical

## 2021-10-02 VITALS — BP 148/94 | HR 67 | Temp 98.2°F | Resp 16 | Wt 205.0 lb

## 2021-10-02 DIAGNOSIS — R030 Elevated blood-pressure reading, without diagnosis of hypertension: Secondary | ICD-10-CM

## 2021-10-02 DIAGNOSIS — R0602 Shortness of breath: Secondary | ICD-10-CM

## 2021-10-02 DIAGNOSIS — H18821 Corneal disorder due to contact lens, right eye: Secondary | ICD-10-CM

## 2021-10-02 DIAGNOSIS — Z0000 Encounter for general adult medical examination without abnormal findings: Secondary | ICD-10-CM

## 2021-10-02 NOTE — Patient Instructions (Signed)

## 2021-10-02 NOTE — Progress Notes (Signed)
   Subjective:    Patient ID: Tim Hendrix, male    DOB: 03/16/1956, 65 y.o.   MRN: [id number]  HPI Here for check up on his eye, he has been seen twice by [facility]. Placed on Polymixin-Neomycin Dexametnasone by [facility]   Has  PCP appointment with [facility] Nov 11 Tim Hendrix.  Blood pressure (!) 148/94, pulse 67, temperature 98.2 F (36.8 C), temperature source Tympanic, resp. rate 16, weight 205 lb (93 kg), SpO2 96 %.   Review of Systems  Eyes:  Positive for discharge and redness. Negative for photophobia, pain, itching and visual disturbance.  Respiratory:  Positive for shortness of breath (gets short of breath with climbing  2 flights of stairs, he noticed this after he had Covid-19 June or July).   Cardiovascular:  Negative for chest pain.  Neurological:  Negative for dizziness, syncope, light-headedness and headaches.      Objective:   Physical Exam Vitals and nursing note reviewed.  Constitutional:      Appearance: Normal appearance.  HENT:     Head: Normocephalic and atraumatic.  Eyes:     Pupils: Pupils are equal, round, and reactive to light.  Pulmonary:     Effort: Pulmonary effort is normal.     Breath sounds: Normal breath sounds.     Comments: Easily takes deep breaths Neurological:     Mental Status: He is alert.          Assessment & Plan:  Corneal Abrasion right eye healing Blood work ordered for  1,2, 3 program to getting a PCP. Elevated blood pressure w/o dx of hypertension. Keep follow up  with [facility] as scheduled. Then to schedule appointment for review of labs and start a Blood pressure mediciation. Patient verbalizes understanding and has no questions at discharge.

## 2021-10-12 ENCOUNTER — Other Ambulatory Visit: Payer: Self-pay | Admitting: Nurse Practitioner

## 2021-10-12 ENCOUNTER — Other Ambulatory Visit: Payer: Self-pay

## 2021-10-12 DIAGNOSIS — Z0000 Encounter for general adult medical examination without abnormal findings: Secondary | ICD-10-CM

## 2021-10-13 LAB — CMP12+LP+TP+TSH+6AC+PSA+CBC…
ALT: 17 IU/L (ref 0–44)
AST: 17 IU/L (ref 0–40)
Albumin/Globulin Ratio: 1.9 (ref 1.2–2.2)
Albumin: 4.4 g/dL (ref 3.8–4.8)
Alkaline Phosphatase: 62 IU/L (ref 44–121)
BUN/Creatinine Ratio: 18 (ref 10–24)
BUN: 14 mg/dL (ref 8–27)
Basophils Absolute: 0 10*3/uL (ref 0.0–0.2)
Basos: 0 %
Bilirubin Total: 0.5 mg/dL (ref 0.0–1.2)
Calcium: 9.2 mg/dL (ref 8.6–10.2)
Chloride: 101 mmol/L (ref 96–106)
Chol/HDL Ratio: 5.3 ratio — ABNORMAL HIGH (ref 0.0–5.0)
Cholesterol, Total: 205 mg/dL — ABNORMAL HIGH (ref 100–199)
Creatinine, Ser: 0.78 mg/dL (ref 0.76–1.27)
EOS (ABSOLUTE): 0.1 10*3/uL (ref 0.0–0.4)
Eos: 3 %
Estimated CHD Risk: 1.1 times avg. — ABNORMAL HIGH (ref 0.0–1.0)
Free Thyroxine Index: 2 (ref 1.2–4.9)
GGT: 10 IU/L (ref 0–65)
Globulin, Total: 2.3 g/dL (ref 1.5–4.5)
Glucose: 87 mg/dL (ref 70–99)
HDL: 39 mg/dL — ABNORMAL LOW (ref >39)
Hematocrit: 48.6 % (ref 37.5–51.0)
Hemoglobin: 16.3 g/dL (ref 13.0–17.7)
Immature Grans (Abs): 0 10*3/uL (ref 0.0–0.1)
Immature Granulocytes: 0 %
Iron: 147 ug/dL (ref 38–169)
LDH: 171 IU/L (ref 121–224)
LDL Chol Calc (NIH): 139 mg/dL — ABNORMAL HIGH (ref 0–99)
Lymphocytes Absolute: 2.5 10*3/uL (ref 0.7–3.1)
Lymphs: 48 %
MCH: 31.2 pg (ref 26.6–33.0)
MCHC: 33.5 g/dL (ref 31.5–35.7)
MCV: 93 fL (ref 79–97)
Monocytes Absolute: 0.4 10*3/uL (ref 0.1–0.9)
Monocytes: 7 %
Neutrophils Absolute: 2.2 10*3/uL (ref 1.4–7.0)
Neutrophils: 42 %
Phosphorus: 3.4 mg/dL (ref 2.8–4.1)
Platelets: 153 10*3/uL (ref 150–450)
Potassium: 4.9 mmol/L (ref 3.5–5.2)
Prostate Specific Ag, Serum: 0.4 ng/mL (ref 0.0–4.0)
RBC: 5.23 x10E6/uL (ref 4.14–5.80)
RDW: 12.7 % (ref 11.6–15.4)
Sodium: 140 mmol/L (ref 134–144)
T3 Uptake Ratio: 27 % (ref 24–39)
T4, Total: 7.3 ug/dL (ref 4.5–12.0)
TSH: 1.27 u[IU]/mL (ref 0.450–4.500)
Total Protein: 6.7 g/dL (ref 6.0–8.5)
Triglycerides: 151 mg/dL — ABNORMAL HIGH (ref 0–149)
Uric Acid: 5.5 mg/dL (ref 3.8–8.4)
VLDL Cholesterol Cal: 27 mg/dL (ref 5–40)
WBC: 5.3 10*3/uL (ref 3.4–10.8)
eGFR: 100 mL/min/{1.73_m2} (ref >59)

## 2021-10-13 LAB — HGB A1C W/O EAG: Hgb A1c MFr Bld: 5.4 % (ref 4.8–5.6)

## 2021-10-13 LAB — VITAMIN D 25 HYDROXY (VIT D DEFICIENCY, FRACTURES): Vit D, 25-Hydroxy: 49 ng/mL (ref 30.0–100.0)

## 2021-10-16 ENCOUNTER — Other Ambulatory Visit: Payer: Self-pay

## 2021-10-16 ENCOUNTER — Ambulatory Visit: Payer: Self-pay | Admitting: Medical

## 2021-10-16 ENCOUNTER — Encounter: Payer: Self-pay | Admitting: Medical

## 2021-10-16 VITALS — BP 142/82 | HR 64 | Temp 98.2°F | Resp 16

## 2021-10-16 DIAGNOSIS — E785 Hyperlipidemia, unspecified: Secondary | ICD-10-CM

## 2021-10-16 DIAGNOSIS — Z9289 Personal history of other medical treatment: Secondary | ICD-10-CM

## 2021-10-16 NOTE — Patient Instructions (Addendum)
[facility]   [address], [place], [place] [address]  ~7 mi [phone]    Triglycerides Test Why am I having this test? Triglycerides are a type of fat in the body. Having a high level of triglycerides can increase your risk for heart disease. You may have this test as part of a routine physical exam. Health care providers recommend that adults have this test at least once every 5 years. If you have risk factors for heart disease or are being treated for high triglycerides, you may need to have this test more often. What is being tested? This test measures the amount of triglycerides in your blood. Triglycerides are naturally present in the body, and you also take in triglycerides by eating certain foods. Triglycerides can come from refined carbohydrate foods, which are foods high in added sugars and low in nutrients and fiber, and from alcohol. Triglycerides store energy eaten at meals for use between meals. If you eat more than you burn, this extra energy can result in high triglycerides. Triglycerides may be measured as part of a test called a lipid profile, which tests triglycerides and cholesterol levels. What kind of sample is taken? A blood sample is required for this test. It may be collected by inserting a needle into a blood vessel or by pricking a fingertip with a small needle (finger stick). How do I prepare for this test? Follow instructions from your health care provider about changing or stopping your regular medicines. Do not eat or drink anything except water starting 9-12 hours before your test, or as long as told by your health care provider. Do not drink alcohol starting at least 24 hours before your test. Follow any instructions from your health care provider about dietary restrictions before your test. Tell a health care provider about: All medicines you are taking, including vitamins, herbs, eye drops, creams, and  over-the-counter medicines. Any bleeding problems you have. Any medical conditions you have. How are the results reported? Your test results will be reported as a value that indicates how many triglycerides are in your blood. This will be given as milligrams of triglycerides per deciliter of blood (mg/dL). Your health care provider will compare your results to normal values that were established after testing a large group of people (reference ranges). Reference ranges may vary among labs and hospitals. For this test, common reference ranges are: Adults: Male: 40-160 mg/dL or 4.48-1.85 mmol/L (SI units). Male: 35-135 mg/dL or 6.31-4.97 mmol/L (SI units). Teens 66-46 years old: Male: 40-163 mg/dL. Male: 40-128 mg/dL. Children 71-74 years old: Male: 36-138 mg/dL. Male: 41-138 mg/dL. Children 43-29 years old: Male: 31-108 mg/dL. Male: 35-114 mg/dL. Children 5 years or younger: Male: 30-86 mg/dL. Male: 32-99 mg/dL. What do the results mean? Results that are within the reference range are considered normal. This means that you have a normal amount of triglycerides in your blood. Results that are higher than your reference range mean that there are too many triglycerides in your blood. This may mean that you: Have a higher risk of heart disease. Have certain diseases that cause high triglycerides, such as diabetes. Are taking certain medicines such as estrogens and oral contraceptives. Talk with your health care provider about what your results mean. Questions to ask your health care provider Ask your health care provider, or the department that is doing the test: When will my results be ready? How will I get my results? What are my treatment options? What  other tests do I need? What are my next steps? Summary Triglycerides are a type of fat in the body. Having a high level of triglycerides can increase your risk for heart disease. You may have this test as part of a routine  physical exam. Triglycerides may be measured as part of a test called a lipid profile, which tests triglycerides and cholesterol. Talk with your health care provider about what your results mean. This information is not intended to replace advice given to you by your health care provider. Make sure you discuss any questions you have with your health care provider. Document Revised: 03/15/2021 Document Reviewed: 03/15/2021 Elsevier Patient Education  2022 [employer].

## 2021-10-16 NOTE — Progress Notes (Signed)
  Lab reviewed with patient . He will continue to  decrease his sodium.     142/82 today, last visit  148/94  He is now Eating  [place] sausage instead of pork and  doing egg white, and  english muffins instead of biscuits.   Has chosen a PCP: appointment on  10/27/21. Dellia Nims  at  [facility] appointment on Friday in a week and a 1/2.          One month follow up on blood pressure.

## 2021-11-15 ENCOUNTER — Other Ambulatory Visit: Payer: Self-pay

## 2021-11-15 ENCOUNTER — Ambulatory Visit: Payer: Self-pay | Admitting: Medical

## 2021-11-15 VITALS — BP 150/88 | HR 79

## 2021-11-15 DIAGNOSIS — E785 Hyperlipidemia, unspecified: Secondary | ICD-10-CM

## 2021-11-15 DIAGNOSIS — R030 Elevated blood-pressure reading, without diagnosis of hypertension: Secondary | ICD-10-CM

## 2021-11-15 NOTE — Patient Instructions (Signed)

## 2021-11-15 NOTE — Progress Notes (Signed)
   Subjective:    Patient ID: Tim Hendrix, male    DOB: 12-10-1956, 65 y.o.   MRN: [id number]  HPI 65 yo male in non acute distress.Come in today for blood pressure recheck. 150/88 today had been working in the rain today, doing a lot of work. 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  Recheck  of BP 148/90  Sees Jolene Provost NP at [facility].  Currently his BP is monitored by his  primary care provider..  He had his chest x-ray at his PCP clinic and all was normal per patient.  Blood pressure (!) 150/88, pulse 79, SpO2 97 %.  Allergies  Allergen Reactions   Penicillin G Hives and Rash    Review of Systems  Eyes:  Negative for visual disturbance.  Respiratory:  Negative for cough and shortness of breath.   Cardiovascular:  Negative for chest pain.  Neurological:  Negative for dizziness, syncope, light-headedness and headaches.      Objective:   Physical Exam Vitals and nursing note reviewed.  Constitutional:      Appearance: Normal appearance.  Eyes:     Extraocular Movements: Extraocular movements intact.     Conjunctiva/sclera: Conjunctivae normal.     Pupils: Pupils are equal, round, and reactive to light.  Cardiovascular:     Rate and Rhythm: Normal rate and regular rhythm.     Heart sounds: No murmur heard.   No friction rub. No gallop.  Pulmonary:     Effort: Pulmonary effort is normal.     Breath sounds: Normal breath sounds.  Neurological:     Mental Status: He is alert.          Assessment & Plan:  Elevated Blood Pressure Reviewed labs with patient. Check BP at home and take notes, share with PCP. Follow up with your PCP in  2 weeks. Review DASH diet and if any questions call the clinic. Patient verbalizes understanding and has no questions at discharge.

## 2021-12-19 ENCOUNTER — Emergency Department
Admission: EM | Admit: 2021-12-19 | Discharge: 2021-12-19 | Disposition: A | Discharge status: Home / Self Care | Payer: BC Managed Care – PPO | Attending: Emergency Medicine | Admitting: Emergency Medicine

## 2021-12-19 ENCOUNTER — Other Ambulatory Visit: Payer: Self-pay

## 2021-12-19 ENCOUNTER — Emergency Department: Payer: BC Managed Care – PPO

## 2021-12-19 DIAGNOSIS — N200 Calculus of kidney: Secondary | ICD-10-CM

## 2021-12-19 DIAGNOSIS — R1032 Left lower quadrant pain: Secondary | ICD-10-CM | POA: Diagnosis present

## 2021-12-19 DIAGNOSIS — R109 Unspecified abdominal pain: Secondary | ICD-10-CM

## 2021-12-19 DIAGNOSIS — N202 Calculus of kidney with calculus of ureter: Secondary | ICD-10-CM | POA: Insufficient documentation

## 2021-12-19 LAB — URINALYSIS, ROUTINE W REFLEX MICROSCOPIC
Bilirubin Urine: NEGATIVE
Glucose, UA: NEGATIVE mg/dL
Hgb urine dipstick: NEGATIVE
Ketones, ur: NEGATIVE mg/dL
Leukocytes,Ua: NEGATIVE
Nitrite: NEGATIVE
Protein, ur: 30 mg/dL — AB
Specific Gravity, Urine: 1.03 (ref 1.005–1.030)
Squamous Epithelial / HPF: NONE SEEN (ref 0–5)
pH: 5 (ref 5.0–8.0)

## 2021-12-19 LAB — BASIC METABOLIC PANEL
Anion gap: 7 (ref 5–15)
BUN: 20 mg/dL (ref 8–23)
CO2: 25 mmol/L (ref 22–32)
Calcium: 8.7 mg/dL — ABNORMAL LOW (ref 8.9–10.3)
Chloride: 99 mmol/L (ref 98–111)
Creatinine, Ser: 0.84 mg/dL (ref 0.61–1.24)
GFR, Estimated: >60 mL/min (ref >60)
Glucose, Bld: 110 mg/dL — ABNORMAL HIGH (ref 70–99)
Potassium: 3.6 mmol/L (ref 3.5–5.1)
Sodium: 131 mmol/L — ABNORMAL LOW (ref 135–145)

## 2021-12-19 LAB — CBC
HCT: 44.9 % (ref 39.0–52.0)
Hemoglobin: 15.8 g/dL (ref 13.0–17.0)
MCH: 31.7 pg (ref 26.0–34.0)
MCHC: 35.2 g/dL (ref 30.0–36.0)
MCV: 90.2 fL (ref 80.0–100.0)
Platelets: 154 10*3/uL (ref 150–400)
RBC: 4.98 MIL/uL (ref 4.22–5.81)
RDW: 12.1 % (ref 11.5–15.5)
WBC: 12.1 10*3/uL — ABNORMAL HIGH (ref 4.0–10.5)
nRBC: 0 % (ref 0.0–0.2)

## 2021-12-19 MED ORDER — SODIUM CHLORIDE 0.9 % IV BOLUS
1000.0000 mL | Freq: Once | INTRAVENOUS | Status: AC
  Administered 2021-12-19: 1000 mL via INTRAVENOUS

## 2021-12-19 MED ORDER — MORPHINE SULFATE (PF) 4 MG/ML IV SOLN: 6.0000 mg | Freq: Once | INTRAVENOUS | Status: DC

## 2021-12-19 MED ORDER — TAMSULOSIN HCL 0.4 MG PO CAPS: 0.4000 mg | ORAL_CAPSULE | Freq: Every day | ORAL | 0 refills | Status: AC

## 2021-12-19 MED ORDER — ONDANSETRON 4 MG PO TBDP: 4.0000 mg | ORAL_TABLET | Freq: Three times a day (TID) | ORAL | 0 refills | Status: AC | PRN

## 2021-12-19 MED ORDER — KETOROLAC TROMETHAMINE 30 MG/ML IJ SOLN
15.0000 mg | Freq: Once | INTRAMUSCULAR | Status: AC
  Administered 2021-12-19: 15 mg via INTRAVENOUS
  Filled 2021-12-19: qty 1

## 2021-12-19 MED ORDER — ONDANSETRON HCL 4 MG/2ML IJ SOLN
4.0000 mg | Freq: Once | INTRAMUSCULAR | Status: AC
  Administered 2021-12-19: 4 mg via INTRAVENOUS
  Filled 2021-12-19: qty 2

## 2021-12-19 MED ORDER — HYDROCODONE-ACETAMINOPHEN 5-325 MG PO TABS: 1.0000 | ORAL_TABLET | Freq: Four times a day (QID) | ORAL | 0 refills | Status: AC | PRN

## 2021-12-19 NOTE — ED Provider Notes (Signed)
[facility] Provider Note    Event Date/Time   First MD Initiated Contact with Patient 12/19/21 1356     (approximate)   History   Flank Pain   HPI  Tim Hendrix is a 66 y.o. male here with left flank pain.  The patient states that starting this morning, he developed left flank and lower abdominal pain radiating towards his left scrotum.  Pain began fairly abruptly.  It was initially mild then severe and associated with nausea and vomiting.  He tried to drink plenty of fluid but threw this back up.  He states that he now feels somewhat better although he continues to have about 2 out of 10 aching, gnawing pain.  Symptoms feel similar to his previous kidney stones.  Denies any fevers or chills.  Denies recent medication changes.  He has had no further vomiting.  No diarrhea.  No known sick contacts.  No history of previous stent or lithotripsy requirements.     Physical Exam   Triage Vital Signs: ED Triage Vitals  Enc Vitals Group     BP 12/19/21 1118 (!) 153/89     Pulse Rate 12/19/21 1118 (!) 52     Resp 12/19/21 1118 16     Temp 12/19/21 1118 98.3 F (36.8 C)     Temp Source 12/19/21 1118 Oral     SpO2 12/19/21 1118 98 %     Weight 12/19/21 1337 205 lb 0.4 oz (93 kg)     Height 12/19/21 1337 5\' 9"  (1.753 m)     Head Circumference --      Peak Flow --      Pain Score --      Pain Loc --      Pain Edu? --      Excl. in [place]? --     Most recent vital signs: Vitals:   12/19/21 1118  BP: (!) 153/89  Pulse: (!) 52  Resp: 16  Temp: 98.3 F (36.8 C)  SpO2: 98%     General: Awake, no distress.  CV:  Good peripheral perfusion.  Resp:  Normal effort.  Abd:  No distention.  Nontender.  Specifically, no left CVA tenderness, left lower quadrant tenderness.  No guarding or rebound. Other:  Moist mucous membranes.  No distress.  Resting comfortably.   ED Results / Procedures / Treatments   Labs (all labs ordered are listed, but only  abnormal results are displayed) Labs Reviewed  URINALYSIS, ROUTINE W REFLEX MICROSCOPIC - Abnormal; Notable for the following components:      Result Value   Color, Urine STRAW (*)    APPearance TURBID (*)    Protein, ur 30 (*)    Bacteria, UA RARE (*)    All other components within normal limits  BASIC METABOLIC PANEL - Abnormal; Notable for the following components:   Sodium 131 (*)    Glucose, Bld 110 (*)    Calcium 8.7 (*)    All other components within normal limits  CBC - Abnormal; Notable for the following components:   WBC 12.1 (*)    All other components within normal limits     EKG     RADIOLOGY CT stone reviewed by me, shows bilateral nephrolithiasis with likely obstructing left sided kidney stone with mild hydro-.  No other acute abnormality noted.  Agree with radiology read.    PROCEDURES:  Critical Care performed: No  Procedures   MEDICATIONS ORDERED IN ED: Medications  morphine  4 MG/ML injection 6 mg (has no administration in time range)  ondansetron (ZOFRAN) injection 4 mg (has no administration in time range)  ketorolac (TORADOL) 30 MG/ML injection 15 mg (has no administration in time range)  sodium chloride 0.9 % bolus 1,000 mL (has no administration in time range)     IMPRESSION / MDM / ASSESSMENT AND PLAN / ED COURSE  I reviewed the triage vital signs and the nursing notes.                              Differential diagnosis includes, but is not limited to, kidney stone, colitis, diverticulitis, obstruction, ileus, constipation, UTI, no scrotal pain or testicular pain or swelling to suggest torsion or epididymitis.  Will appearing 66 year old male here with left flank pain.  Patient is hemodynamically stable and well-appearing.  He has a mild leukocytosis but no fever, chills, or evidence to suggest infection.  UA shows no significant pyuria or hematuria.  BMP is unremarkable with normal renal function.  Creatinine is 0.84.  Calcium is 8.7.  CT  renal stone obtained, shows left 5 mm calculus.  No evidence of obstruction or other intra-abdominal emergency.  I reviewed patient's previous records, which showed no evidence of previous urology intervention.  He feels markedly improved after fluids and analgesia here.  He is tolerating p.o.  Considered admission, but given that pain is controlled with no signs of infection and well-controlled pain with no further vomiting, patient would like outpatient management which I think is reasonable at this time.  He was given IV fluids, IV morphine, and IV Toradol for pain control here.  Will discharge with analgesics, antiemetics, and Flomax.  Encouraged hydration.       FINAL CLINICAL IMPRESSION(S) / ED DIAGNOSES   Final diagnoses:  Nephrolithiasis  Left flank pain     Rx / DC Orders   ED Discharge Orders          Ordered    HYDROcodone-acetaminophen (NORCO/VICODIN) 5-325 MG tablet  Every 6 hours PRN        12/19/21 1451    tamsulosin (FLOMAX) 0.4 MG CAPS capsule  Daily        12/19/21 1451    ondansetron (ZOFRAN-ODT) 4 MG disintegrating tablet  Every 8 hours PRN        12/19/21 1451             Note:  This document was prepared using Dragon voice recognition software and may include unintentional dictation errors.   Duffy Bruce, MD 12/19/21 1452

## 2021-12-19 NOTE — ED Triage Notes (Signed)
Pt c/o left flank pain radiating around to the left testicle for the past hour, "I have a kidney stone" pt states he has a hx of them in the past and this feels the same

## 2021-12-19 NOTE — ED Notes (Signed)
See triage note  presents with left flank pain    states he has a hx of renal stone  feels this same

## 2022-06-24 ENCOUNTER — Encounter: Payer: Self-pay | Admitting: Intensive Care

## 2022-06-24 ENCOUNTER — Other Ambulatory Visit: Payer: Self-pay

## 2022-06-24 ENCOUNTER — Emergency Department: Payer: BC Managed Care – PPO

## 2022-06-24 ENCOUNTER — Emergency Department
Admission: EM | Admit: 2022-06-24 | Discharge: 2022-06-24 | Disposition: A | Discharge status: Home / Self Care | Payer: BC Managed Care – PPO | Attending: Student in an Organized Health Care Education/Training Program | Admitting: Student in an Organized Health Care Education/Training Program

## 2022-06-24 DIAGNOSIS — N200 Calculus of kidney: Secondary | ICD-10-CM

## 2022-06-24 DIAGNOSIS — N132 Hydronephrosis with renal and ureteral calculous obstruction: Secondary | ICD-10-CM | POA: Insufficient documentation

## 2022-06-24 DIAGNOSIS — R8271 Bacteriuria: Secondary | ICD-10-CM | POA: Diagnosis not present

## 2022-06-24 DIAGNOSIS — R109 Unspecified abdominal pain: Secondary | ICD-10-CM | POA: Diagnosis present

## 2022-06-24 LAB — URINALYSIS, ROUTINE W REFLEX MICROSCOPIC
Glucose, UA: NEGATIVE mg/dL
Ketones, ur: NEGATIVE mg/dL
Leukocytes,Ua: NEGATIVE
Nitrite: NEGATIVE
Protein, ur: 30 mg/dL — AB
Specific Gravity, Urine: >1.03 — ABNORMAL HIGH (ref 1.005–1.030)
pH: 5 (ref 5.0–8.0)

## 2022-06-24 LAB — BASIC METABOLIC PANEL
Anion gap: 6 (ref 5–15)
BUN: 19 mg/dL (ref 8–23)
CO2: 26 mmol/L (ref 22–32)
Calcium: 8.8 mg/dL — ABNORMAL LOW (ref 8.9–10.3)
Chloride: 105 mmol/L (ref 98–111)
Creatinine, Ser: 0.85 mg/dL (ref 0.61–1.24)
GFR, Estimated: >60 mL/min (ref >60)
Glucose, Bld: 120 mg/dL — ABNORMAL HIGH (ref 70–99)
Potassium: 3.9 mmol/L (ref 3.5–5.1)
Sodium: 137 mmol/L (ref 135–145)

## 2022-06-24 LAB — URINALYSIS, MICROSCOPIC (REFLEX)

## 2022-06-24 LAB — CBC
HCT: 45.9 % (ref 39.0–52.0)
Hemoglobin: 15.6 g/dL (ref 13.0–17.0)
MCH: 31.6 pg (ref 26.0–34.0)
MCHC: 34 g/dL (ref 30.0–36.0)
MCV: 92.9 fL (ref 80.0–100.0)
Platelets: 172 10*3/uL (ref 150–400)
RBC: 4.94 MIL/uL (ref 4.22–5.81)
RDW: 12.3 % (ref 11.5–15.5)
WBC: 9.3 10*3/uL (ref 4.0–10.5)
nRBC: 0 % (ref 0.0–0.2)

## 2022-06-24 MED ORDER — OXYCODONE-ACETAMINOPHEN 5-325 MG PO TABS: 1.0000 | ORAL_TABLET | ORAL | 0 refills | Status: AC | PRN

## 2022-06-24 MED ORDER — SODIUM CHLORIDE 0.9 % IV BOLUS
1000.0000 mL | Freq: Once | INTRAVENOUS | Status: AC
  Administered 2022-06-24: 1000 mL via INTRAVENOUS

## 2022-06-24 MED ORDER — SODIUM CHLORIDE 0.9 % IV SOLN
1.0000 g | Freq: Once | INTRAVENOUS | Status: AC
  Administered 2022-06-24: 1 g via INTRAVENOUS
  Filled 2022-06-24: qty 10

## 2022-06-24 MED ORDER — ONDANSETRON HCL 4 MG/2ML IJ SOLN
4.0000 mg | Freq: Once | INTRAMUSCULAR | Status: AC
  Administered 2022-06-24: 4 mg via INTRAVENOUS
  Filled 2022-06-24: qty 2

## 2022-06-24 MED ORDER — CEFDINIR 300 MG PO CAPS: 300.0000 mg | ORAL_CAPSULE | Freq: Two times a day (BID) | ORAL | 0 refills | Status: AC

## 2022-06-24 MED ORDER — OXYCODONE-ACETAMINOPHEN 5-325 MG PO TABS
1.0000 | ORAL_TABLET | Freq: Once | ORAL | Status: DC
  Filled 2022-06-24: qty 1

## 2022-06-24 MED ORDER — SODIUM CHLORIDE 0.9 % IV BOLUS
500.0000 mL | Freq: Once | INTRAVENOUS | Status: AC
  Administered 2022-06-24: 500 mL via INTRAVENOUS

## 2022-06-24 MED ORDER — TAMSULOSIN HCL 0.4 MG PO CAPS: 0.4000 mg | ORAL_CAPSULE | Freq: Every day | ORAL | 0 refills | Status: AC

## 2022-06-24 MED ORDER — MORPHINE SULFATE (PF) 4 MG/ML IV SOLN
4.0000 mg | INTRAVENOUS | Status: DC | PRN
  Administered 2022-06-24: 4 mg via INTRAVENOUS
  Filled 2022-06-24: qty 1

## 2022-06-24 MED ORDER — KETOROLAC TROMETHAMINE 30 MG/ML IJ SOLN
15.0000 mg | Freq: Once | INTRAMUSCULAR | Status: AC
  Administered 2022-06-24: 15 mg via INTRAVENOUS
  Filled 2022-06-24: qty 1

## 2022-06-24 MED ORDER — ONDANSETRON 4 MG PO TBDP: 4.0000 mg | ORAL_TABLET | Freq: Three times a day (TID) | ORAL | 0 refills | Status: AC | PRN

## 2022-06-24 MED ORDER — MORPHINE SULFATE (PF) 4 MG/ML IV SOLN
4.0000 mg | Freq: Once | INTRAVENOUS | Status: AC
  Administered 2022-06-24: 4 mg via INTRAVENOUS
  Filled 2022-06-24: qty 1

## 2022-06-24 NOTE — ED Provider Notes (Signed)
[facility] Provider Note    Event Date/Time   First MD Initiated Contact with Patient 06/24/22 1132     (approximate)   History   Flank Pain   HPI  Tim Hendrix is a 66 y.o. male history of kidney stones presents to the ER for evaluation of acute right flank pain feels similar to previous kidney stones sudden onset associated with nausea and achy throbbing pain.  Denies any chest pain or shortness of breath.  Denies any dysuria.  No chills.     Physical Exam   Triage Vital Signs: ED Triage Vitals  Enc Vitals Group     BP 06/24/22 1130 (!) 200/92     Pulse Rate 06/24/22 1130 (!) 53     Resp 06/24/22 1130 20     Temp 06/24/22 1128 (!) 97.5 F (36.4 C)     Temp Source 06/24/22 1128 Oral     SpO2 06/24/22 1130 95 %     Weight 06/24/22 1129 210 lb (95.3 kg)     Height 06/24/22 1129 5\' 9"  (1.753 m)     Head Circumference --      Peak Flow --      Pain Score 06/24/22 1128 10     Pain Loc --      Pain Edu? --      Excl. in GC? --     Most recent vital signs: Vitals:   06/24/22 1330 06/24/22 1400  BP: 127/64 118/70  Pulse: (!) 47 (!) 44  Resp: 12 12  Temp:    SpO2: 94% 96%     Constitutional: Alert  Eyes: Conjunctivae are normal.  Head: Atraumatic. Nose: No congestion/rhinnorhea. Mouth/Throat: Mucous membranes are moist.   Neck: Painless ROM.  Cardiovascular:   Good peripheral circulation. Respiratory: Normal respiratory effort.  No retractions.  Gastrointestinal: Soft and nontender in all four quadrants Musculoskeletal:  no deformity Neurologic:  MAE spontaneously. No gross focal neurologic deficits are appreciated.  Skin:  Skin is warm, dry and intact. No rash noted. Psychiatric: Mood and affect are normal. Speech and behavior are normal.    ED Results / Procedures / Treatments   Labs (all labs ordered are listed, but only abnormal results are displayed) Labs Reviewed  URINALYSIS, ROUTINE W REFLEX MICROSCOPIC -  Abnormal; Notable for the following components:      Result Value   Color, Urine AMBER (*)    APPearance CLOUDY (*)    Specific Gravity, Urine >1.030 (*)    Hgb urine dipstick LARGE (*)    Bilirubin Urine SMALL (*)    Protein, ur 30 (*)    All other components within normal limits  BASIC METABOLIC PANEL - Abnormal; Notable for the following components:   Glucose, Bld 120 (*)    Calcium 8.8 (*)    All other components within normal limits  URINALYSIS, MICROSCOPIC (REFLEX) - Abnormal; Notable for the following components:   Bacteria, UA MANY (*)    All other components within normal limits  CBC     EKG     RADIOLOGY Please see ED Course for my review and interpretation.  I personally reviewed all radiographic images ordered to evaluate for the above acute complaints and reviewed radiology reports and findings.  These findings were personally discussed with the patient.  Please see medical record for radiology report.    PROCEDURES:  Critical Care performed:   Procedures   MEDICATIONS ORDERED IN ED: Medications  morphine (PF) 4 MG/ML  injection 4 mg (4 mg Intravenous Given 06/24/22 1140)  oxyCODONE-acetaminophen (PERCOCET/ROXICET) 5-325 MG per tablet 1 tablet (0 tablets Oral Hold 06/24/22 1344)  ondansetron (ZOFRAN) injection 4 mg (4 mg Intravenous Given 06/24/22 1140)  sodium chloride 0.9 % bolus 500 mL (0 mLs Intravenous Stopped 06/24/22 1311)  ketorolac (TORADOL) 30 MG/ML injection 15 mg (15 mg Intravenous Given 06/24/22 1140)  morphine (PF) 4 MG/ML injection 4 mg (4 mg Intravenous Given 06/24/22 1310)  cefTRIAXone (ROCEPHIN) 1 g in sodium chloride 0.9 % 100 mL IVPB (0 g Intravenous Stopped 06/24/22 1337)  sodium chloride 0.9 % bolus 1,000 mL (1,000 mLs Intravenous New Bag/Given 06/24/22 1344)     IMPRESSION / MDM / ASSESSMENT AND PLAN / ED COURSE  I reviewed the triage vital signs and the nursing notes.                              Differential diagnosis includes, but is not  limited to, kidney stone, pyelonephritis, musculoskeletal strain, AAA, colitis  Patient presented to the ER for evaluation of acute right flank pain as described above.  Given differential and the patient's acute pain and discomfort, this presenting complaint could reflect a potentially life-threatening illness therefore the patient will be placed on continuous pulse oximetry and telemetry for monitoring.  Laboratory evaluation will be sent to evaluate for the above complaints.   Will order CT imaging for further evaluation.  On review of previous medical records and previous CT imaging does not have evidence of AAA.  Does not seem consistent with dissection.  Will order IV fluids IV morphine and IV antiemetic.    Clinical Course as of 06/24/22 1436  Sun Jun 24, 2022  1216 CT imaging on my review interpretation does show evidence of right distal ureteral stone.  Still awaiting formal radiology report. [PR]  1313 Patient does have many bacteria in his urine no white count no fever having additional pain and therefore will give additional dose of IV morphine.  Patient has listed allergy to penicillin remotely was just a rash.  Will give dose of IV Rocephin while he is on monitor.  I will consult urology. [PR]  1342 Patient reassessed.  Patient receiving Rocephin without any complication or allergic reaction.  Discussed case in consultation with Dr. Arita Miss of urology agrees to follow-up patient in clinic no indication for hospitalization at this time as his distal stone no signs of sepsis.  Patient feels well. [PR]  1435 Reassessed.  Remains well-appearing hemodynamically stable to ambulate with a steady gait. tolerating p.o.  No sign of reaction or intolerance to Rocephin.  This point do believe he stable and appropriate for outpatient follow-up. [PR]    Clinical Course User Index [PR] Willy Eddy, MD     FINAL CLINICAL IMPRESSION(S) / ED DIAGNOSES   Final diagnoses:  Kidney stone     Rx /  DC Orders   ED Discharge Orders          Ordered    oxyCODONE-acetaminophen (PERCOCET) 5-325 MG tablet  Every 4 hours PRN        06/24/22 1334    tamsulosin (FLOMAX) 0.4 MG CAPS capsule  Daily after supper        06/24/22 1334    ondansetron (ZOFRAN-ODT) 4 MG disintegrating tablet  Every 8 hours PRN        06/24/22 1334    cefdinir (OMNICEF) 300 MG capsule  2 times daily  06/24/22 1335             Note:  This document was prepared using Dragon voice recognition software and may include unintentional dictation errors.    Willy Eddy, MD 06/24/22 1436

## 2022-06-24 NOTE — ED Triage Notes (Signed)
Patient c/o right sided flank pain that started 3 hours ago. C/o emesis and nausea

## 2022-06-24 NOTE — ED Notes (Signed)
Pt endorsing increased nausea and pain at this time

## 2022-06-24 NOTE — ED Notes (Signed)
DC ppw provided. Pt followup and RX information reviewed. Pt -provides verbal consent for Dc at this time.

## 2022-06-29 ENCOUNTER — Ambulatory Visit: Payer: Self-pay | Admitting: Adult Health

## 2022-06-29 DIAGNOSIS — I700 Atherosclerosis of aorta: Secondary | ICD-10-CM

## 2022-06-29 NOTE — Progress Notes (Signed)
Patient had questions about CT report and labs from ER visit.  Specifically about the noted Aortic Atherosclerosis on his CT.  Explained causes, and lifestyle modifications.  Encouraged him to discuss with his primary care provider, but reassured him nothing needed to be done at this time urgently.  Follow up with [place] as needed, but make sure to keep regularly appointments with PCP for ongoing surveillance of AA.    Johnna Acosta DNP, NP-C Nurse Practitioner

## 2022-09-13 IMAGING — CT CT RENAL STONE PROTOCOL
2 of 4 series · 16 of 46 positions shown, 18 images · non-contrast
Comparison: 01/28/2019

CLINICAL DATA: Left flank pain

EXAM:
CT ABDOMEN AND PELVIS WITHOUT CONTRAST
TECHNIQUE: Multidetector CT imaging of the abdomen and pelvis was performed
following the standard protocol without IV contrast.

[Series 2: stone full standard · axial · 0.84mm/px · z∈[-1432,-976]mm · 13 of 101 slices shown, 15 images]
[im 5/101  soft-tissue]
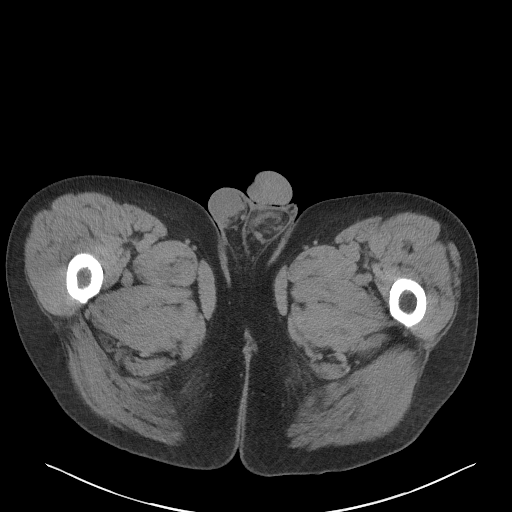
[im 5/101  bone]
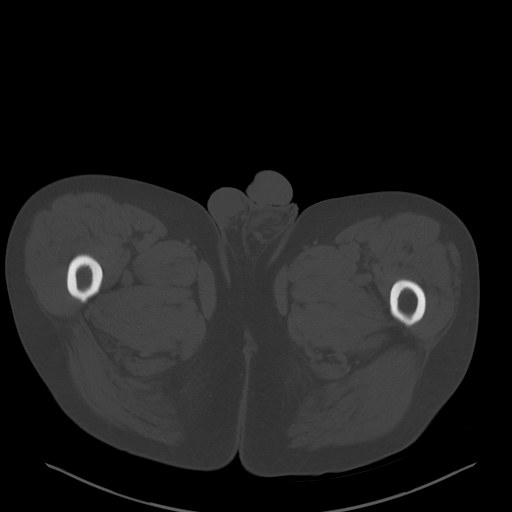
[im 14/101  soft-tissue]
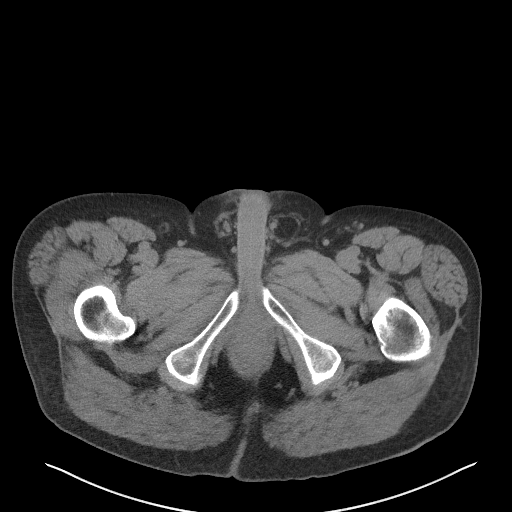
[im 22/101  soft-tissue]
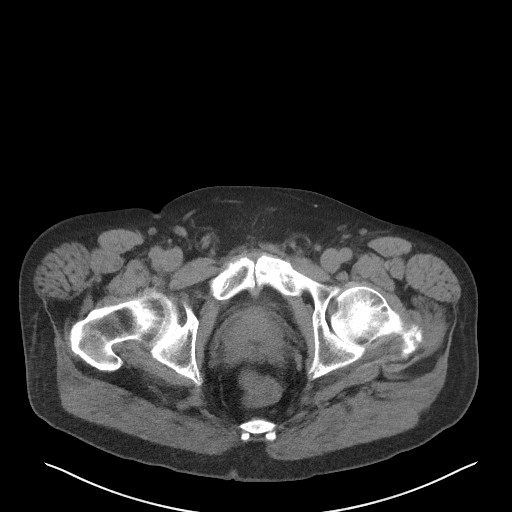
[im 27/101  soft-tissue]
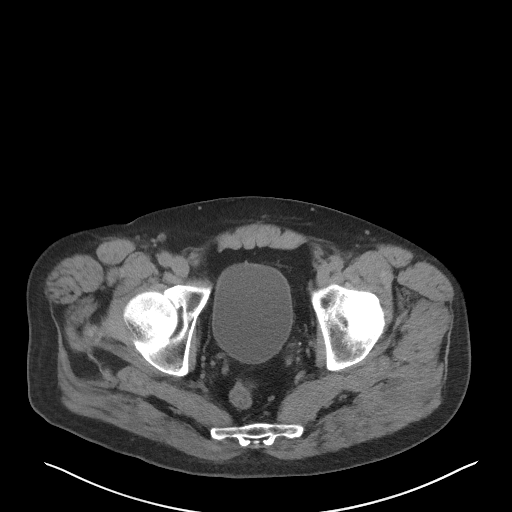
[im 35/101  soft-tissue]
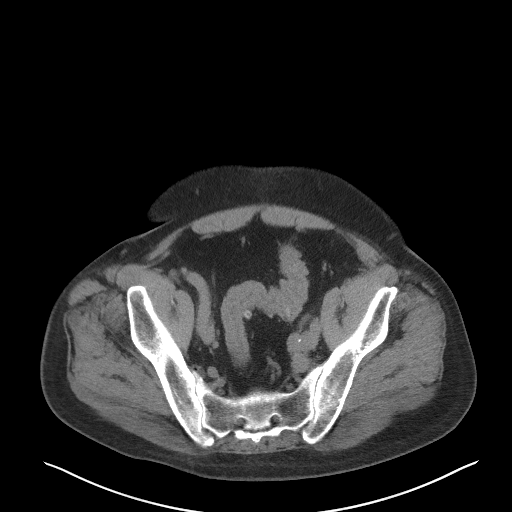
[im 44/101  soft-tissue]
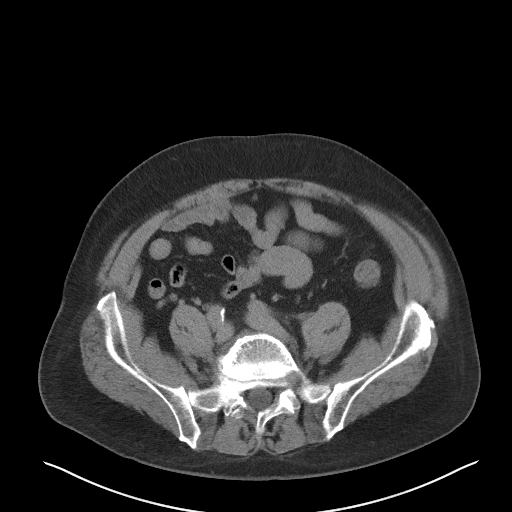
[im 53/101  soft-tissue]
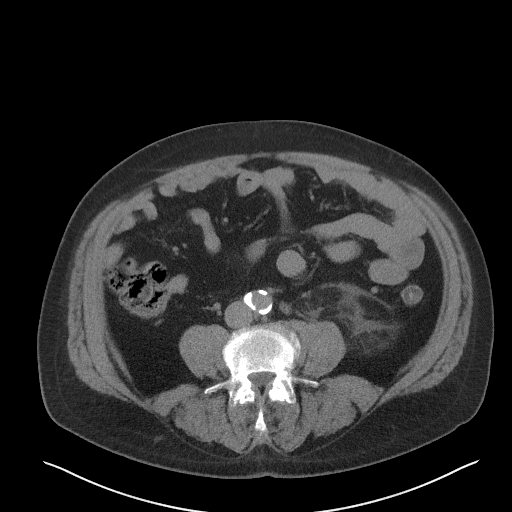
[im 57/101  soft-tissue]
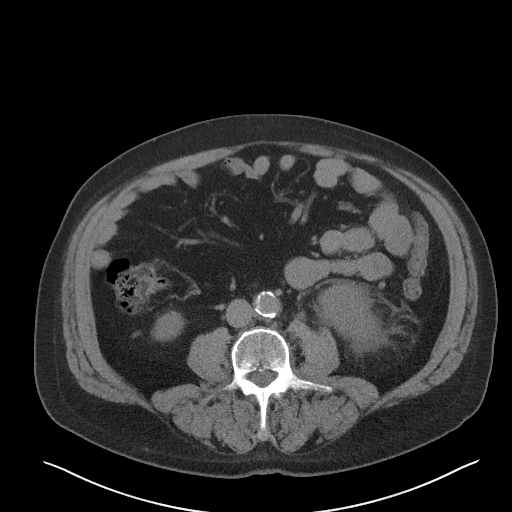
[im 66/101  soft-tissue]
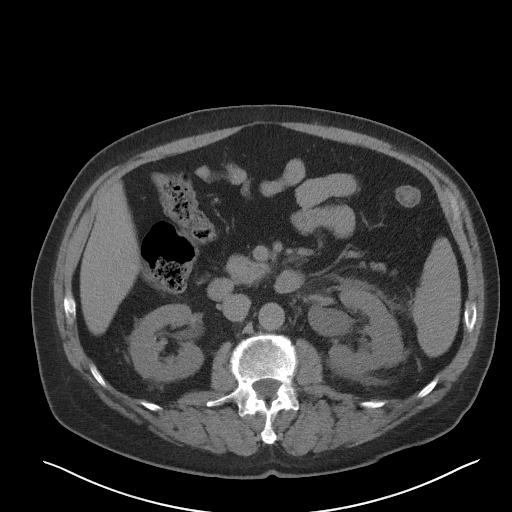
[im 66/101  bone]
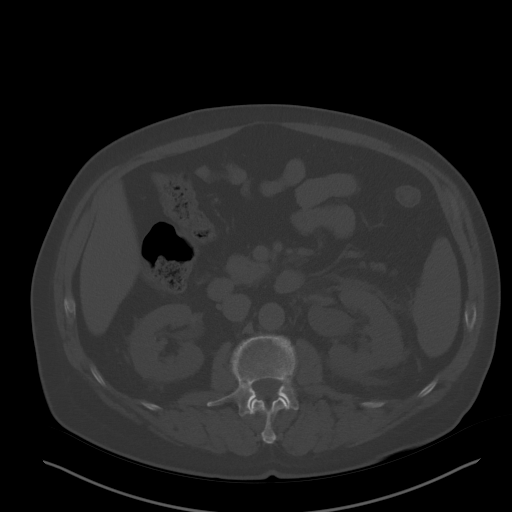
[im 74/101  soft-tissue]
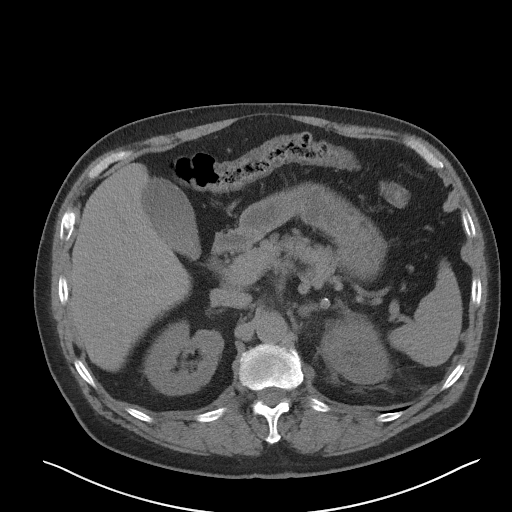
[im 79/101  soft-tissue]
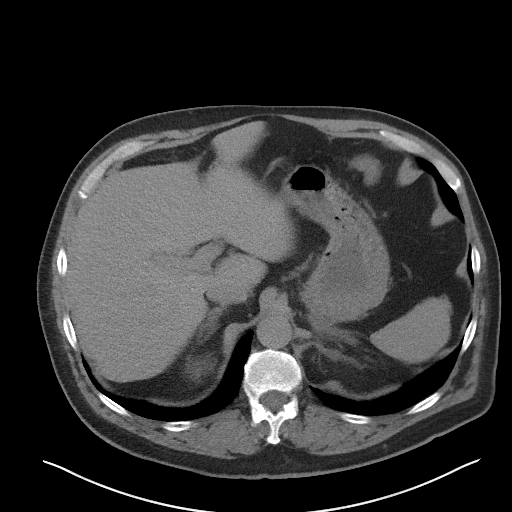
[im 87/101  soft-tissue]
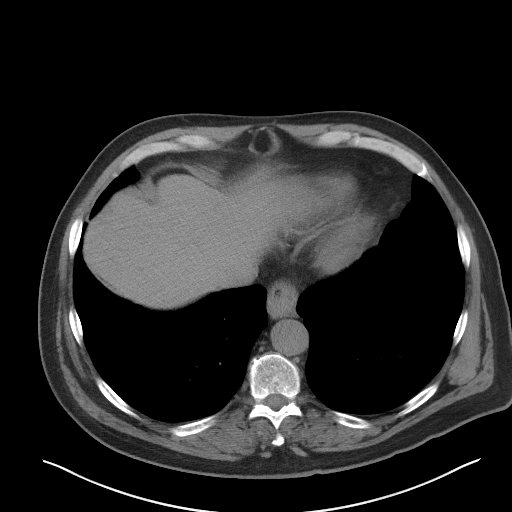
[im 96/101  soft-tissue]
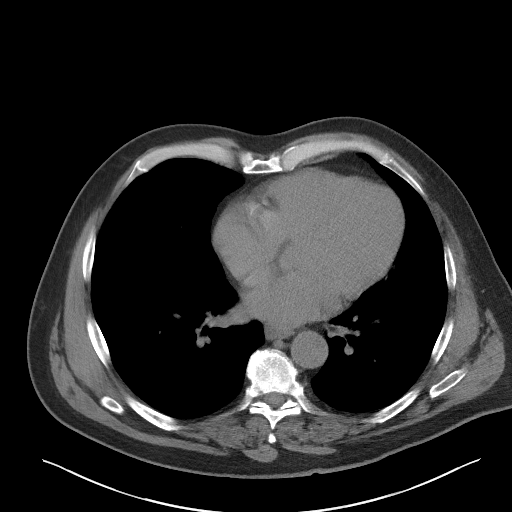

[Series 5: coronal · coronal · 0.84mm/px · 3 of 158 slices shown]
[im 53/158  soft-tissue]
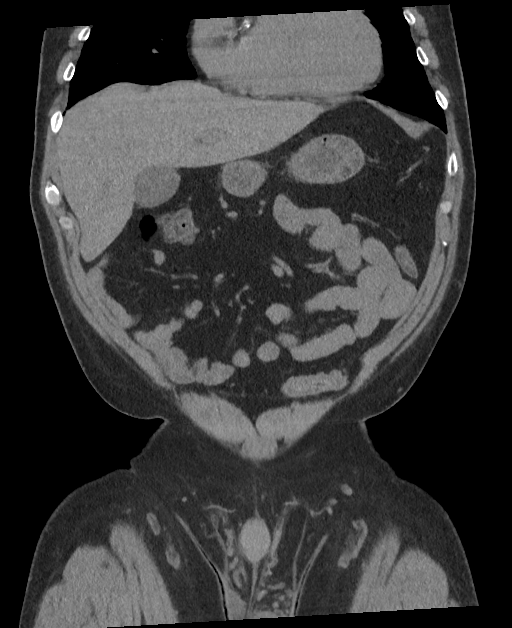
[im 70/158  soft-tissue]
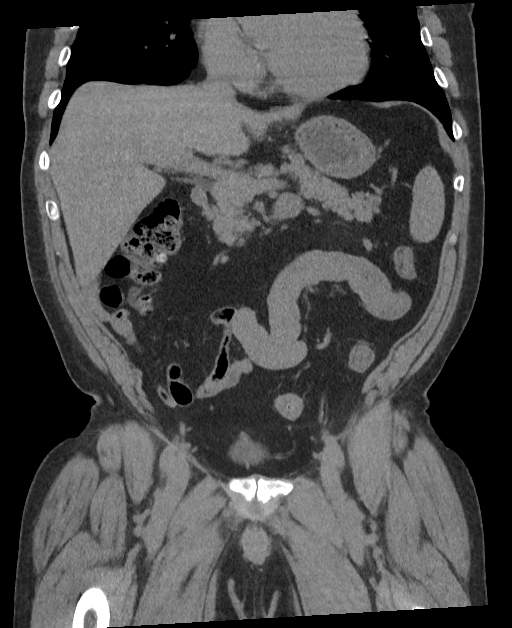
[im 88/158  soft-tissue]
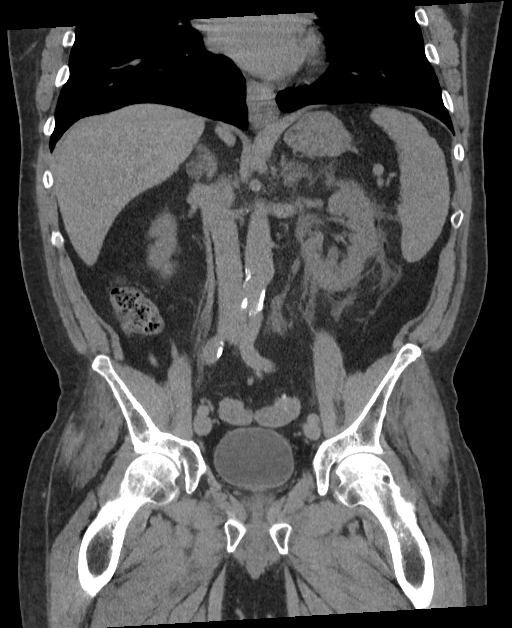

[16 of 46 positions shown; findings below may reference images not displayed]

FINDINGS: Lower chest: Coronary calcifications. No pleural or pericardial
effusion. Visualized lung bases clear.

Hepatobiliary: No focal liver abnormality is seen. No gallstones,
gallbladder wall thickening, or biliary dilatation.

Pancreas: Unremarkable. No pancreatic ductal dilatation or
surrounding inflammatory changes.

Spleen: Normal in size without focal abnormality.

Adrenals/Urinary Tract: Adrenal glands normal. 1 mm nonobstructive
calculi in the mid and lower right renal collecting system. 1 mm
upper pole left renal calculus. Inflammatory/edematous changes
around the left kidney with mild hydronephrosis, and ureterectasis
down to the level of a 5mm distal ureteral calculus approximally 2
cm from the UPJ. The urinary bladder is physiologically distended.

Stomach/Bowel: Small hiatal hernia. Stomach is nondistended. Small
bowel decompressed. Normal appendix. Colon nondilated with a few
sigmoid diverticula.

Vascular/Lymphatic: Scattered aortoiliac calcified atheromatous
plaque without aneurysm. No abdominal or pelvic adenopathy.

Reproductive: Prostate is unremarkable.

Other: No ascites.  No free air.

Musculoskeletal: Mild spondylitic changes L4-5, L5-S1. Bilateral
pars defects L5 allows grade 1 anterolisthesis.
IMPRESSION: 1. Bilateral nephrolithiasis with obstructing 5 mm distal left
ureteral calculus.
2. Coronary and Aortic Atherosclerosis (RBIO0-170.0).
3. Small hiatal hernia
4. Scattered sigmoid diverticula.

## 2022-11-01 ENCOUNTER — Ambulatory Visit (INDEPENDENT_AMBULATORY_CARE_PROVIDER_SITE_OTHER): Payer: Self-pay | Admitting: Nurse Practitioner

## 2022-11-01 ENCOUNTER — Encounter: Payer: Self-pay | Admitting: Nurse Practitioner

## 2022-11-01 VITALS — BP 160/90 | HR 70 | Temp 98.6°F | Ht 69.0 in | Wt 213.8 lb

## 2022-11-01 DIAGNOSIS — J0110 Acute frontal sinusitis, unspecified: Secondary | ICD-10-CM

## 2022-11-01 MED ORDER — DOXYCYCLINE HYCLATE 100 MG PO TABS: 100.0000 mg | ORAL_TABLET | Freq: Two times a day (BID) | ORAL | 0 refills | Status: AC

## 2022-11-01 MED ORDER — FLUTICASONE PROPIONATE 50 MCG/ACT NA SUSP: 2.0000 | Freq: Every day | NASAL | 6 refills | Status: AC

## 2022-11-01 NOTE — Progress Notes (Signed)
[facility] [address], [place] [address] [phone]  Office Visit Note  Patient Name: Tim Hendrix Date of Birth [id number]  Medical Record number [id number]  Date of Service: 11/01/2022  Chief Complaint  Patient presents with   Sinusitis    Sneezing, left side teeth pain and pressure above left eye. ST morning and at night. Mucus is yellow mixed with blood. No fever, BA or ear pain.     HPI 66 year old male presenting to [facility] with complaints of sinus pressure more on the left side of his face. Pressure in teeth and sore throat from PND. Slight productive cough at night from PND only.   No fever   He has been using Mucinex OTC and aleve for relief   Current Medication:  Outpatient Encounter Medications as of 11/01/2022  Medication Sig   Cholecalciferol (VITAMIN D) 2000 units tablet Take 2,000 Units by mouth daily.   Omega-3 Fatty Acids (FISH OIL PO) Take 1 capsule by mouth daily.   Turmeric 500 MG CAPS Take 2 capsules by mouth daily.   UNABLE TO FIND Joint Juice   ciprofloxacin (CILOXAN) 0.3 % ophthalmic solution Place 1 drop into both eyes every 2 (two) hours. Administer 1 drop, every 2 hours, while awake, for 2 days. Then 1 drop, every 4 hours, while awake, for the next 5 days. (Patient not taking: Reported on 11/01/2022)   HYDROcodone-acetaminophen (NORCO/VICODIN) 5-325 MG tablet Take 1-2 tablets by mouth every 6 (six) hours as needed for moderate pain or severe pain. (Patient not taking: Reported on 11/01/2022)   Lidocaine-Glycerin (PREPARATION H RE) Place rectally. (Patient not taking: Reported on 09/26/2021)   meloxicam (MOBIC) 15 MG tablet Take by mouth. (Patient not taking: Reported on 10/02/2021)   ondansetron (ZOFRAN-ODT) 4 MG disintegrating tablet Take 1 tablet (4 mg total) by mouth every 8 (eight) hours as needed for nausea or vomiting. (Patient not taking: Reported on 11/01/2022)   ondansetron (ZOFRAN-ODT) 4 MG  disintegrating tablet Take 1 tablet (4 mg total) by mouth every 8 (eight) hours as needed for nausea or vomiting. (Patient not taking: Reported on 11/01/2022)   oxyCODONE-acetaminophen (PERCOCET) 5-325 MG tablet Take 1 tablet by mouth every 4 (four) hours as needed for severe pain. (Patient not taking: Reported on 11/01/2022)   tamsulosin (FLOMAX) 0.4 MG CAPS capsule Take 1 capsule (0.4 mg total) by mouth daily after supper. (Patient not taking: Reported on 11/01/2022)   No facility-administered encounter medications on file as of 11/01/2022.      Medical History: Past Medical History:  Diagnosis Date   Kidney stones      Vital Signs: BP (!) 160/90 (BP Location: Left Arm, Patient Position: Sitting, Cuff Size: Normal)   Pulse 70   Temp 98.6 F (37 C) (Tympanic)   Ht 5\' 9"  (1.753 m)   Wt 213 lb 12.8 oz (97 kg)   SpO2 99%   BMI 31.57 kg/m    Review of Systems  Constitutional:  Negative for fever.  HENT:  Positive for congestion, sinus pressure and sore throat.   Eyes: Negative.   Respiratory:  Positive for cough.   Cardiovascular: Negative.   Genitourinary: Negative.   Musculoskeletal: Negative.   Neurological: Negative.     Physical Exam HENT:     Head: Normocephalic.     Right Ear: A middle ear effusion is present.     Left Ear: A middle ear effusion is present.     Nose: Congestion  present.     Left Sinus: Maxillary sinus tenderness present.     Mouth/Throat:     Mouth: Mucous membranes are moist.     Pharynx: Posterior oropharyngeal erythema present.     Comments: PND noted  Cardiovascular:     Rate and Rhythm: Normal rate and regular rhythm.     Heart sounds: Normal heart sounds.  Pulmonary:     Effort: Pulmonary effort is normal.     Breath sounds: Normal breath sounds.  Musculoskeletal:        General: Normal range of motion.     Cervical back: Normal range of motion.  Skin:    General: Skin is warm.     Capillary Refill: Capillary refill takes less  than 2 seconds.  Neurological:     General: No focal deficit present.     Mental Status: He is alert and oriented to person, place, and time. Mental status is at baseline.  Psychiatric:        Mood and Affect: Mood normal.       Assessment/Plan: 1. Acute non-recurrent frontal sinusitis  - doxycycline (VIBRA-TABS) 100 MG tablet; Take 1 tablet (100 mg total) by mouth 2 (two) times daily for 10 days.  Dispense: 20 tablet; Refill: 0 - fluticasone (FLONASE) 50 MCG/ACT nasal spray; Place 2 sprays into both nostrils daily.  Dispense: 16 g; Refill: 6    General Counseling: Ricci verbalizes understanding of the findings of todays visit and agrees with plan of treatment. I have discussed any further diagnostic evaluation that may be needed or ordered today. We also reviewed his medications today. he has been encouraged to call the office with any questions or concerns that should arise related to todays visit.   Time spent:15 Minutes   Viviano Simas [facility] Family Nurse Practitioner

## 2023-09-26 ENCOUNTER — Other Ambulatory Visit: Payer: Self-pay

## 2023-09-26 DIAGNOSIS — Z01818 Encounter for other preprocedural examination: Secondary | ICD-10-CM

## 2023-09-26 DIAGNOSIS — E785 Hyperlipidemia, unspecified: Secondary | ICD-10-CM

## 2023-09-26 DIAGNOSIS — I700 Atherosclerosis of aorta: Secondary | ICD-10-CM

## 2023-09-26 DIAGNOSIS — R0602 Shortness of breath: Secondary | ICD-10-CM

## 2023-09-27 LAB — COMPREHENSIVE METABOLIC PANEL
ALT: 17 [IU]/L (ref 0–44)
AST: 13 [IU]/L (ref 0–40)
Albumin: 4.4 g/dL (ref 3.9–4.9)
Alkaline Phosphatase: 62 [IU]/L (ref 44–121)
BUN/Creatinine Ratio: 18 (ref 10–24)
BUN: 15 mg/dL (ref 8–27)
Bilirubin Total: 0.4 mg/dL (ref 0.0–1.2)
CO2: 24 mmol/L (ref 20–29)
Calcium: 9.1 mg/dL (ref 8.6–10.2)
Chloride: 104 mmol/L (ref 96–106)
Creatinine, Ser: 0.83 mg/dL (ref 0.76–1.27)
Globulin, Total: 2.2 g/dL (ref 1.5–4.5)
Glucose: 82 mg/dL (ref 70–99)
Potassium: 4.4 mmol/L (ref 3.5–5.2)
Sodium: 143 mmol/L (ref 134–144)
Total Protein: 6.6 g/dL (ref 6.0–8.5)
eGFR: 97 mL/min/{1.73_m2} (ref >59)

## 2023-09-27 LAB — CBC
Hematocrit: 47.5 % (ref 37.5–51.0)
Hemoglobin: 15.8 g/dL (ref 13.0–17.7)
MCH: 31.7 pg (ref 26.6–33.0)
MCHC: 33.3 g/dL (ref 31.5–35.7)
MCV: 95 fL (ref 79–97)
Platelets: 166 10*3/uL (ref 150–450)
RBC: 4.98 x10E6/uL (ref 4.14–5.80)
RDW: 12.7 % (ref 11.6–15.4)
WBC: 6 10*3/uL (ref 3.4–10.8)

## 2023-09-27 LAB — LIPID PANEL
Chol/HDL Ratio: 4.8 {ratio} (ref 0.0–5.0)
Cholesterol, Total: 216 mg/dL — ABNORMAL HIGH (ref 100–199)
HDL: 45 mg/dL (ref >39)
LDL Chol Calc (NIH): 149 mg/dL — ABNORMAL HIGH (ref 0–99)
Triglycerides: 120 mg/dL (ref 0–149)
VLDL Cholesterol Cal: 22 mg/dL (ref 5–40)

## 2023-09-27 LAB — TSH: TSH: 1.59 u[IU]/mL (ref 0.450–4.500)

## 2023-09-27 LAB — VITAMIN D 25 HYDROXY (VIT D DEFICIENCY, FRACTURES): Vit D, 25-Hydroxy: 56.2 ng/mL (ref 30.0–100.0)

## 2024-10-28 ENCOUNTER — Ambulatory Visit: Payer: Self-pay

## 2024-10-28 DIAGNOSIS — Z01818 Encounter for other preprocedural examination: Secondary | ICD-10-CM

## 2024-10-28 DIAGNOSIS — E785 Hyperlipidemia, unspecified: Secondary | ICD-10-CM

## 2024-10-28 DIAGNOSIS — I700 Atherosclerosis of aorta: Secondary | ICD-10-CM

## 2024-10-29 LAB — LIPID PANEL
Chol/HDL Ratio: 6.3 ratio — ABNORMAL HIGH (ref 0.0–5.0)
Cholesterol, Total: 228 mg/dL — ABNORMAL HIGH (ref 100–199)
HDL: 36 mg/dL — ABNORMAL LOW (ref >39)
LDL Chol Calc (NIH): 151 mg/dL — ABNORMAL HIGH (ref 0–99)
Triglycerides: 223 mg/dL — ABNORMAL HIGH (ref 0–149)
VLDL Cholesterol Cal: 41 mg/dL — ABNORMAL HIGH (ref 5–40)

## 2024-10-29 LAB — CBC
Hematocrit: 46.3 % (ref 37.5–51.0)
Hemoglobin: 15.6 g/dL (ref 13.0–17.7)
MCH: 32.2 pg (ref 26.6–33.0)
MCHC: 33.7 g/dL (ref 31.5–35.7)
MCV: 96 fL (ref 79–97)
Platelets: 174 x10E3/uL (ref 150–450)
RBC: 4.84 x10E6/uL (ref 4.14–5.80)
RDW: 12.8 % (ref 11.6–15.4)
WBC: 5.4 x10E3/uL (ref 3.4–10.8)

## 2024-10-29 LAB — BASIC METABOLIC PANEL WITH GFR
BUN/Creatinine Ratio: 23 (ref 10–24)
BUN: 16 mg/dL (ref 8–27)
CO2: 25 mmol/L (ref 20–29)
Calcium: 9.1 mg/dL (ref 8.6–10.2)
Chloride: 102 mmol/L (ref 96–106)
Creatinine, Ser: 0.71 mg/dL — ABNORMAL LOW (ref 0.76–1.27)
Glucose: 85 mg/dL (ref 70–99)
Potassium: 4.1 mmol/L (ref 3.5–5.2)
Sodium: 143 mmol/L (ref 134–144)
eGFR: 101 mL/min/1.73 (ref >59)

## 2024-10-29 NOTE — Progress Notes (Signed)
 Pt not seen, blood drawn per Pre OP orders.
